# Patient Record
Sex: Female | Born: 1995 | Race: Black or African American | Hispanic: No | Marital: Single | Smoking: Never smoker
Health system: Southern US, Community
[De-identification: ages and names within clinical notes are randomized; demographics above are authoritative.]

## PROBLEM LIST (undated history)

## (undated) DIAGNOSIS — N83209 Unspecified ovarian cyst, unspecified side: Secondary | ICD-10-CM

## (undated) DIAGNOSIS — E0590 Thyrotoxicosis, unspecified without thyrotoxic crisis or storm: Secondary | ICD-10-CM

## (undated) DIAGNOSIS — F419 Anxiety disorder, unspecified: Secondary | ICD-10-CM

## (undated) DIAGNOSIS — D649 Anemia, unspecified: Secondary | ICD-10-CM

## (undated) DIAGNOSIS — J45909 Unspecified asthma, uncomplicated: Secondary | ICD-10-CM

## (undated) DIAGNOSIS — D219 Benign neoplasm of connective and other soft tissue, unspecified: Secondary | ICD-10-CM

## (undated) DIAGNOSIS — F32A Depression, unspecified: Secondary | ICD-10-CM

## (undated) DIAGNOSIS — R569 Unspecified convulsions: Secondary | ICD-10-CM

## (undated) HISTORY — PX: DILATION AND CURETTAGE OF UTERUS: SHX78

## (undated) HISTORY — PX: WISDOM TOOTH EXTRACTION: SHX21

---

## 2021-07-27 ENCOUNTER — Other Ambulatory Visit: Payer: Self-pay

## 2021-07-27 ENCOUNTER — Emergency Department (HOSPITAL_COMMUNITY)
Admission: EM | Admit: 2021-07-27 | Discharge: 2021-07-27 | Disposition: A | Discharge status: Home / Self Care | Payer: Self-pay | Attending: Emergency Medicine | Admitting: Emergency Medicine

## 2021-07-27 ENCOUNTER — Encounter (HOSPITAL_COMMUNITY): Payer: Self-pay | Admitting: Emergency Medicine

## 2021-07-27 ENCOUNTER — Emergency Department (HOSPITAL_COMMUNITY): Payer: Self-pay

## 2021-07-27 DIAGNOSIS — R109 Unspecified abdominal pain: Secondary | ICD-10-CM | POA: Insufficient documentation

## 2021-07-27 DIAGNOSIS — R197 Diarrhea, unspecified: Secondary | ICD-10-CM | POA: Insufficient documentation

## 2021-07-27 DIAGNOSIS — R112 Nausea with vomiting, unspecified: Secondary | ICD-10-CM | POA: Insufficient documentation

## 2021-07-27 LAB — COMPREHENSIVE METABOLIC PANEL
ALT: 12 U/L (ref 0–44)
AST: 16 U/L (ref 15–41)
Albumin: 4.8 g/dL (ref 3.5–5.0)
Alkaline Phosphatase: 53 U/L (ref 38–126)
Anion gap: 6 (ref 5–15)
BUN: 7 mg/dL (ref 6–20)
CO2: 27 mmol/L (ref 22–32)
Calcium: 9.9 mg/dL (ref 8.9–10.3)
Chloride: 109 mmol/L (ref 98–111)
Creatinine, Ser: 0.77 mg/dL (ref 0.44–1.00)
GFR, Estimated: >60 mL/min (ref >60)
Glucose, Bld: 97 mg/dL (ref 70–99)
Potassium: 3.3 mmol/L — ABNORMAL LOW (ref 3.5–5.1)
Sodium: 142 mmol/L (ref 135–145)
Total Bilirubin: 0.9 mg/dL (ref 0.3–1.2)
Total Protein: 8.1 g/dL (ref 6.5–8.1)

## 2021-07-27 LAB — URINALYSIS, ROUTINE W REFLEX MICROSCOPIC
Bilirubin Urine: NEGATIVE
Glucose, UA: NEGATIVE mg/dL
Hgb urine dipstick: NEGATIVE
Ketones, ur: NEGATIVE mg/dL
Leukocytes,Ua: NEGATIVE
Nitrite: NEGATIVE
Protein, ur: NEGATIVE mg/dL
Specific Gravity, Urine: 1.02 (ref 1.005–1.030)
pH: 6 (ref 5.0–8.0)

## 2021-07-27 LAB — CBC
HCT: 37.6 % (ref 36.0–46.0)
Hemoglobin: 11.5 g/dL — ABNORMAL LOW (ref 12.0–15.0)
MCH: 26.1 pg (ref 26.0–34.0)
MCHC: 30.6 g/dL (ref 30.0–36.0)
MCV: 85.5 fL (ref 80.0–100.0)
Platelets: 475 10*3/uL — ABNORMAL HIGH (ref 150–400)
RBC: 4.4 MIL/uL (ref 3.87–5.11)
RDW: 14.2 % (ref 11.5–15.5)
WBC: 5.3 10*3/uL (ref 4.0–10.5)
nRBC: 0 % (ref 0.0–0.2)

## 2021-07-27 LAB — LACTIC ACID, PLASMA: Lactic Acid, Venous: 1.2 mmol/L (ref 0.5–1.9)

## 2021-07-27 LAB — LIPASE, BLOOD: Lipase: 34 U/L (ref 11–51)

## 2021-07-27 LAB — CBG MONITORING, ED: Glucose-Capillary: 85 mg/dL (ref 70–99)

## 2021-07-27 LAB — I-STAT BETA HCG BLOOD, ED (MC, WL, AP ONLY): I-stat hCG, quantitative: <5 m[IU]/mL (ref <5)

## 2021-07-27 MED ORDER — ONDANSETRON HCL 4 MG/2ML IJ SOLN
INTRAMUSCULAR | Status: AC
  Filled 2021-07-27: qty 2

## 2021-07-27 MED ORDER — ACETAMINOPHEN 325 MG PO TABS
650.0000 mg | ORAL_TABLET | Freq: Once | ORAL | Status: AC
  Administered 2021-07-27: 650 mg via ORAL
  Filled 2021-07-27: qty 2

## 2021-07-27 MED ORDER — ONDANSETRON 4 MG PO TBDP: 4.0000 mg | ORAL_TABLET | Freq: Three times a day (TID) | ORAL | 0 refills | Status: DC | PRN

## 2021-07-27 MED ORDER — OMEPRAZOLE 20 MG PO CPDR: 20.0000 mg | DELAYED_RELEASE_CAPSULE | Freq: Every day | ORAL | 0 refills | Status: DC

## 2021-07-27 MED ORDER — POTASSIUM CHLORIDE 20 MEQ PO PACK
40.0000 meq | PACK | Freq: Once | ORAL | Status: AC
  Administered 2021-07-27: 40 meq via ORAL
  Filled 2021-07-27: qty 2

## 2021-07-27 MED ORDER — ONDANSETRON HCL 4 MG/2ML IJ SOLN
4.0000 mg | Freq: Once | INTRAMUSCULAR | Status: AC
  Administered 2021-07-27: 4 mg via INTRAVENOUS

## 2021-07-27 MED ORDER — SODIUM CHLORIDE 0.9 % IV BOLUS
1000.0000 mL | Freq: Once | INTRAVENOUS | Status: AC
  Administered 2021-07-27: 1000 mL via INTRAVENOUS

## 2021-07-27 NOTE — ED Notes (Signed)
EDP at the bedside.  ?

## 2021-07-27 NOTE — Discharge Instructions (Addendum)
Call your primary care doctor or specialist as discussed in the next 2-3 days.   Return immediately back to the ER if:  Your symptoms worsen within the next 12-24 hours. You develop new symptoms such as new fevers, persistent vomiting, new pain, shortness of breath, or new weakness or numbness, or if you have any other concerns.  

## 2021-07-27 NOTE — ED Provider Notes (Signed)
[facility]-EMERGENCY DEPT Provider Note   CSN: [id number] Arrival date & time: 07/27/21  3382     History Chief Complaint  Patient presents with   Flank Pain   Emesis    Debbie Stewart is a 25 y.o. female.  Patient presents ER chief complaint of right-sided flank pain ongoing since yesterday.  Describes it as sharp and aching persistent.  Associate with nausea and vomiting diarrhea.  She states her vomitus was about 5-7 times yesterday, described as blood-tinged.  Otherwise denies fevers or cough denies headache or chest pain or abdominal pain.  Denies pain with urination or urinary frequency.      History reviewed. No pertinent past medical history.  There are no problems to display for this patient.   History reviewed. No pertinent surgical history.   OB History   No obstetric history on file.     No family history on file.     Home Medications Prior to Admission medications   Medication Sig Start Date End Date Taking? Authorizing Provider  omeprazole (PRILOSEC) 20 MG capsule Take 1 capsule (20 mg total) by mouth daily for 20 days. 07/27/21 08/16/21 Yes Cheryll Cockayne, MD  ondansetron (ZOFRAN ODT) 4 MG disintegrating tablet Take 1 tablet (4 mg total) by mouth every 8 (eight) hours as needed for up to 20 doses for nausea or vomiting. 07/27/21  Yes Meryem Haertel, Eustace Moore, MD    Allergies    Shellfish allergy  Review of Systems   Review of Systems  Constitutional:  Negative for fever.  HENT:  Negative for ear pain.   Eyes:  Negative for pain.  Respiratory:  Negative for cough.   Cardiovascular:  Negative for chest pain.  Gastrointestinal:  Negative for abdominal pain.  Genitourinary:  Positive for flank pain.  Musculoskeletal:  Negative for back pain.  Skin:  Negative for rash.  Neurological:  Negative for headaches.   Physical Exam Updated Vital Signs BP (!) 135/92   Pulse 62   Temp 97.9 F (36.6 C) (Oral)   Resp 17   LMP 07/21/2021  Comment: Per Dr. Audley Hose patient's preg test is negative  SpO2 100%   Physical Exam Constitutional:      General: She is not in acute distress.    Appearance: Normal appearance.  HENT:     Head: Normocephalic.     Nose: Nose normal.  Eyes:     Extraocular Movements: Extraocular movements intact.  Cardiovascular:     Rate and Rhythm: Normal rate.  Pulmonary:     Effort: Pulmonary effort is normal.  Abdominal:     Tenderness: There is no abdominal tenderness. There is right CVA tenderness. There is no guarding or rebound.  Musculoskeletal:        General: Normal range of motion.     Cervical back: Normal range of motion.  Neurological:     General: No focal deficit present.     Mental Status: She is alert. Mental status is at baseline.    ED Results / Procedures / Treatments   Labs (all labs ordered are listed, but only abnormal results are displayed) Labs Reviewed  COMPREHENSIVE METABOLIC PANEL - Abnormal; Notable for the following components:      Result Value   Potassium 3.3 (*)    All other components within normal limits  CBC - Abnormal; Notable for the following components:   Hemoglobin 11.5 (*)    Platelets 475 (*)    All other components within normal limits  CULTURE, BLOOD (ROUTINE X 2)  CULTURE, BLOOD (ROUTINE X 2)  LIPASE, BLOOD  URINALYSIS, ROUTINE W REFLEX MICROSCOPIC  LACTIC ACID, PLASMA  I-STAT BETA HCG BLOOD, ED ([facility], [facility], [facility] ONLY)  CBG MONITORING, ED    EKG None  Radiology CT Abdomen Pelvis Wo Contrast  Result Date: 07/27/2021 CLINICAL DATA:  Right side abdominal pain, flank pain EXAM: CT ABDOMEN AND PELVIS WITHOUT CONTRAST TECHNIQUE: Multidetector CT imaging of the abdomen and pelvis was performed following the standard protocol without IV contrast. COMPARISON:  None. FINDINGS: Lower chest: Lung bases are clear. No effusions. Heart is normal size. Hepatobiliary: No focal hepatic abnormality. Gallbladder unremarkable. Pancreas: No focal abnormality or  ductal dilatation. Spleen: No focal abnormality.  Normal size. Adrenals/Urinary Tract: No adrenal abnormality. No focal renal abnormality. No stones or hydronephrosis. Urinary bladder is unremarkable. Stomach/Bowel: Normal appendix. Stomach, large and small bowel grossly unremarkable. Vascular/Lymphatic: No evidence of aneurysm or adenopathy. Reproductive: Uterus and left adnexa unremarkable. 4 cm cyst in the right adnexa. Other: No free fluid or free air. Musculoskeletal: No acute bony abnormality. IMPRESSION: Normal appendix. 4 cm right adnexal/ovarian cyst. Electronically Signed   By: Charlett Nose M.D.   On: 07/27/2021 10:50    Procedures Procedures   Medications Ordered in ED Medications  potassium chloride (KLOR-CON) packet 40 mEq (has no administration in time range)  sodium chloride 0.9 % bolus 1,000 mL (1,000 mLs Intravenous New Bag/Given 07/27/21 0906)  acetaminophen (TYLENOL) tablet 650 mg (650 mg Oral Given 07/27/21 0913)  ondansetron (ZOFRAN) injection 4 mg (4 mg Intravenous Not Given 07/27/21 0950)    ED Course  I have reviewed the triage vital signs and the nursing notes.  Pertinent labs & imaging results that were available during my care of the patient were reviewed by me and considered in my medical decision making (see chart for details).    MDM Rules/Calculators/A&P                           Labs, urinalysis, CT imaging are all unremarkable no acute findings noted.  Patient vomited here, given Zofran with improvement of symptoms.  Vomitus appears nonbloody bilious.  Labs show hemoglobin 11 vital signs within normal limits.  Will advise outpatient follow-up with her doctor and GI specialist within the week.  Recommending immediate return for worsening pain worsening symptoms fevers or any additional concerns.  Final Clinical Impression(s) / ED Diagnoses Final diagnoses:  Flank pain    Rx / DC Orders ED Discharge Orders          Ordered    ondansetron (ZOFRAN ODT)  4 MG disintegrating tablet  Every 8 hours PRN        07/27/21 1113    omeprazole (PRILOSEC) 20 MG capsule  Daily        07/27/21 1113             Cheryll Cockayne, MD 07/27/21 1114

## 2021-07-27 NOTE — ED Notes (Signed)
Pt ambulatory in ED lobby. 

## 2021-07-27 NOTE — ED Triage Notes (Signed)
Per pt, sates right flank pain since yesterday-states she has also vomited 7 times since symptoms started-blood tinged-

## 2021-07-27 NOTE — ED Notes (Signed)
POC glucose 85. Pt provided 8oz apple juice at this time. EDP aware.

## 2021-07-27 NOTE — ED Notes (Signed)
Patient Beta result is <5.0 IU/L

## 2021-07-27 NOTE — ED Notes (Signed)
Pt actively vomiting at the bedside. IV Zofran 4mg  overridden. EDP notified and aware.

## 2021-07-27 NOTE — ED Notes (Signed)
This nurse started a peripheral IV in pt's right AC and obtained blood work. Pt stated she was dizzy and felt like she was going to pass out. This nurse placed pt in trendelenburg position and obtained blood pressure and pulse which was WNL limits. POC BG obtained confirmed a pt bg of 85. EDP notified and aware.

## 2021-08-01 LAB — CULTURE, BLOOD (ROUTINE X 2)
Culture: NO GROWTH
Culture: NO GROWTH

## 2022-03-19 ENCOUNTER — Emergency Department (HOSPITAL_COMMUNITY)
Admission: EM | Admit: 2022-03-19 | Discharge: 2022-03-19 | Disposition: A | Discharge status: Home / Self Care | Payer: Self-pay | Attending: Student | Admitting: Student

## 2022-03-19 ENCOUNTER — Emergency Department (HOSPITAL_COMMUNITY): Payer: Self-pay

## 2022-03-19 ENCOUNTER — Encounter (HOSPITAL_COMMUNITY): Payer: Self-pay

## 2022-03-19 ENCOUNTER — Other Ambulatory Visit: Payer: Self-pay

## 2022-03-19 DIAGNOSIS — M25511 Pain in right shoulder: Secondary | ICD-10-CM | POA: Insufficient documentation

## 2022-03-19 DIAGNOSIS — R079 Chest pain, unspecified: Secondary | ICD-10-CM | POA: Insufficient documentation

## 2022-03-19 DIAGNOSIS — F419 Anxiety disorder, unspecified: Secondary | ICD-10-CM | POA: Insufficient documentation

## 2022-03-19 DIAGNOSIS — R42 Dizziness and giddiness: Secondary | ICD-10-CM | POA: Insufficient documentation

## 2022-03-19 DIAGNOSIS — R1084 Generalized abdominal pain: Secondary | ICD-10-CM | POA: Insufficient documentation

## 2022-03-19 DIAGNOSIS — K5900 Constipation, unspecified: Secondary | ICD-10-CM | POA: Insufficient documentation

## 2022-03-19 DIAGNOSIS — N9489 Other specified conditions associated with female genital organs and menstrual cycle: Secondary | ICD-10-CM | POA: Insufficient documentation

## 2022-03-19 HISTORY — DX: Anxiety disorder, unspecified: F41.9

## 2022-03-19 HISTORY — DX: Anemia, unspecified: D64.9

## 2022-03-19 HISTORY — DX: Depression, unspecified: F32.A

## 2022-03-19 HISTORY — DX: Unspecified ovarian cyst, unspecified side: N83.209

## 2022-03-19 LAB — CBC WITH DIFFERENTIAL/PLATELET
Abs Immature Granulocytes: 0.01 10*3/uL (ref 0.00–0.07)
Basophils Absolute: 0 10*3/uL (ref 0.0–0.1)
Basophils Relative: 0 %
Eosinophils Absolute: 0.2 10*3/uL (ref 0.0–0.5)
Eosinophils Relative: 2 %
HCT: 31.2 % — ABNORMAL LOW (ref 36.0–46.0)
Hemoglobin: 9.7 g/dL — ABNORMAL LOW (ref 12.0–15.0)
Immature Granulocytes: 0 %
Lymphocytes Relative: 44 %
Lymphs Abs: 3 10*3/uL (ref 0.7–4.0)
MCH: 25.9 pg — ABNORMAL LOW (ref 26.0–34.0)
MCHC: 31.1 g/dL (ref 30.0–36.0)
MCV: 83.2 fL (ref 80.0–100.0)
Monocytes Absolute: 0.5 10*3/uL (ref 0.1–1.0)
Monocytes Relative: 7 %
Neutro Abs: 3.1 10*3/uL (ref 1.7–7.7)
Neutrophils Relative %: 47 %
Platelets: 374 10*3/uL (ref 150–400)
RBC: 3.75 MIL/uL — ABNORMAL LOW (ref 3.87–5.11)
RDW: 14.6 % (ref 11.5–15.5)
WBC: 6.8 10*3/uL (ref 4.0–10.5)
nRBC: 0 % (ref 0.0–0.2)

## 2022-03-19 LAB — COMPREHENSIVE METABOLIC PANEL
ALT: 20 U/L (ref 0–44)
AST: 20 U/L (ref 15–41)
Albumin: 4.2 g/dL (ref 3.5–5.0)
Alkaline Phosphatase: 57 U/L (ref 38–126)
Anion gap: 8 (ref 5–15)
BUN: 8 mg/dL (ref 6–20)
CO2: 23 mmol/L (ref 22–32)
Calcium: 9 mg/dL (ref 8.9–10.3)
Chloride: 107 mmol/L (ref 98–111)
Creatinine, Ser: 0.59 mg/dL (ref 0.44–1.00)
GFR, Estimated: >60 mL/min (ref >60)
Glucose, Bld: 88 mg/dL (ref 70–99)
Potassium: 3.5 mmol/L (ref 3.5–5.1)
Sodium: 138 mmol/L (ref 135–145)
Total Bilirubin: 0.5 mg/dL (ref 0.3–1.2)
Total Protein: 7.4 g/dL (ref 6.5–8.1)

## 2022-03-19 LAB — URINALYSIS, ROUTINE W REFLEX MICROSCOPIC
Bilirubin Urine: NEGATIVE
Glucose, UA: NEGATIVE mg/dL
Hgb urine dipstick: NEGATIVE
Ketones, ur: NEGATIVE mg/dL
Leukocytes,Ua: NEGATIVE
Nitrite: NEGATIVE
Protein, ur: NEGATIVE mg/dL
Specific Gravity, Urine: 1.009 (ref 1.005–1.030)
pH: 6 (ref 5.0–8.0)

## 2022-03-19 LAB — TROPONIN I (HIGH SENSITIVITY)
Troponin I (High Sensitivity): <2 ng/L (ref <18)
Troponin I (High Sensitivity): <2 ng/L (ref <18)

## 2022-03-19 LAB — I-STAT BETA HCG BLOOD, ED (MC, WL, AP ONLY): I-stat hCG, quantitative: <5 m[IU]/mL (ref <5)

## 2022-03-19 MED ORDER — NAPROXEN 375 MG PO TABS: 375.0000 mg | ORAL_TABLET | Freq: Two times a day (BID) | ORAL | 0 refills | Status: DC

## 2022-03-19 MED ORDER — ONDANSETRON HCL 4 MG/2ML IJ SOLN
4.0000 mg | Freq: Once | INTRAMUSCULAR | Status: AC
  Administered 2022-03-19: 4 mg via INTRAVENOUS
  Filled 2022-03-19: qty 2

## 2022-03-19 MED ORDER — MORPHINE SULFATE (PF) 4 MG/ML IV SOLN
4.0000 mg | Freq: Once | INTRAVENOUS | Status: AC
  Administered 2022-03-19: 4 mg via INTRAVENOUS
  Filled 2022-03-19: qty 1

## 2022-03-19 MED ORDER — SODIUM CHLORIDE 0.9 % IV BOLUS
1000.0000 mL | Freq: Once | INTRAVENOUS | Status: AC
  Administered 2022-03-19: 1000 mL via INTRAVENOUS

## 2022-03-19 MED ORDER — DICYCLOMINE HCL 20 MG PO TABS: 20.0000 mg | ORAL_TABLET | Freq: Two times a day (BID) | ORAL | 0 refills | Status: DC

## 2022-03-19 MED ORDER — ONDANSETRON 4 MG PO TBDP: 4.0000 mg | ORAL_TABLET | Freq: Three times a day (TID) | ORAL | 0 refills | Status: DC | PRN

## 2022-03-19 MED ORDER — HYDROXYZINE HCL 25 MG PO TABS: 25.0000 mg | ORAL_TABLET | Freq: Three times a day (TID) | ORAL | 0 refills | Status: DC | PRN

## 2022-03-19 MED ORDER — IOHEXOL 300 MG/ML  SOLN
100.0000 mL | Freq: Once | INTRAMUSCULAR | Status: AC | PRN
Start: 2022-03-19 — End: 2022-03-19
  Administered 2022-03-19: 100 mL via INTRAVENOUS

## 2022-03-19 MED ORDER — SODIUM CHLORIDE (PF) 0.9 % IJ SOLN
INTRAMUSCULAR | Status: AC
  Filled 2022-03-19: qty 50

## 2022-03-19 NOTE — ED Notes (Signed)
Patient aware urine sample is needed. Instructed to call out using call bell for restroom assistance.
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
## 2022-03-19 NOTE — ED Triage Notes (Signed)
Patient c/o mid upper chest pain, SOB and dizziness x 2 hours. Patient states the pain radiates into the right shoulder.   Patient reports an ovarian cyst and also c/o RLQ abdominal pain.

## 2022-03-19 NOTE — Discharge Instructions (Signed)
Your work-up today was reassuring against a serious cause of your chest pain, or abdominal pain.  You do have ovarian cysts bilaterally.  On the right side your cyst measures 4.4 cm, and on the left measures 3.8 cm.  I have sent Zofran and Bentyl to use as needed for abdominal pain.  Also recommended aggressive bowel regimen given your chronic constipation.  You state you used to be followed by a gastroenterologist for this however you do not have one since moving to [place].  I have attached gastroenterology referral for you.  I have sent an naproxen for you to take over the next 7 to 10 days to see if it resolves your chest pain.  You have had significant improvement in your pain level while in the emergency room.  If you have any worsening symptoms please return to the emergency room otherwise establish care with [facility] health community health and wellness clinic.  I have also sent in hydroxyzine into the pharmacy for you to keep on hand and to use as needed for anxiety.

## 2022-03-19 NOTE — ED Provider Triage Note (Signed)
Emergency Medicine Provider Triage Evaluation Note  Debbie Stewart , a 26 y.o. female  was evaluated in triage.  Pt complains of chest pain, shortness of breath, and RLQ abdominal pain. She states that her symptoms all began a few hours ago with substernal chest pain that is sharp in nature and radiates to her right arm. She states that her hand is numb as well. She states that the pain is worse with inspiration and relieved by taking shallow breaths. She also states that she has RLQ pain consistent with when she has had a ruptured ovarian cyst in the pain. States that she is 8 days late on her menstrual cycle but attributes this to taking plan B 2 weeks ago.  Denies fevers, chills, nausea, vomiting, or diarrhea, leg pain, or leg swelling.  Review of Systems  Positive:  Negative: See above  Physical Exam  BP 130/81 (BP Location: Right Arm)   Pulse 75   Temp 98.1 F (36.7 C) (Oral)   Resp 16   Ht 5\' 7"  (1.702 m)   Wt 81.6 kg   LMP 02/11/2022   SpO2 100%   BMI 28.19 kg/m  Gen:   Awake, no distress   Resp:  Normal effort  MSK:   Moves extremities without difficulty  Other:  Right upper chest is tender to palpation. RLQ tenderness to palpation  Medical Decision Making  Medically screening exam initiated at 5:22 PM.  Appropriate orders placed.  Debbie Stewart was informed that the remainder of the evaluation will be completed by another provider, this initial triage assessment does not replace that evaluation, and the importance of remaining in the ED until their evaluation is complete.     Bud Face, PA-C 03/19/22 1726

## 2022-06-19 ENCOUNTER — Other Ambulatory Visit: Payer: Self-pay

## 2022-06-19 ENCOUNTER — Emergency Department (HOSPITAL_COMMUNITY)
Admission: EM | Admit: 2022-06-19 | Discharge: 2022-06-19 | Disposition: A | Discharge status: Home / Self Care | Payer: Self-pay | Attending: Emergency Medicine | Admitting: Emergency Medicine

## 2022-06-19 ENCOUNTER — Encounter (HOSPITAL_COMMUNITY): Payer: Self-pay

## 2022-06-19 ENCOUNTER — Emergency Department (HOSPITAL_COMMUNITY): Payer: Self-pay

## 2022-06-19 DIAGNOSIS — O99891 Other specified diseases and conditions complicating pregnancy: Secondary | ICD-10-CM

## 2022-06-19 DIAGNOSIS — O4690 Antepartum hemorrhage, unspecified, unspecified trimester: Secondary | ICD-10-CM

## 2022-06-19 DIAGNOSIS — O209 Hemorrhage in early pregnancy, unspecified: Secondary | ICD-10-CM | POA: Insufficient documentation

## 2022-06-19 DIAGNOSIS — O26891 Other specified pregnancy related conditions, first trimester: Secondary | ICD-10-CM

## 2022-06-19 DIAGNOSIS — O200 Threatened abortion: Secondary | ICD-10-CM | POA: Insufficient documentation

## 2022-06-19 DIAGNOSIS — Z3A01 Less than 8 weeks gestation of pregnancy: Secondary | ICD-10-CM | POA: Insufficient documentation

## 2022-06-19 DIAGNOSIS — R8271 Bacteriuria: Secondary | ICD-10-CM | POA: Insufficient documentation

## 2022-06-19 DIAGNOSIS — O2390 Unspecified genitourinary tract infection in pregnancy, unspecified trimester: Secondary | ICD-10-CM | POA: Insufficient documentation

## 2022-06-19 LAB — CBC WITH DIFFERENTIAL/PLATELET
Abs Immature Granulocytes: 0.02 10*3/uL (ref 0.00–0.07)
Basophils Absolute: 0 10*3/uL (ref 0.0–0.1)
Basophils Relative: 0 %
Eosinophils Absolute: 0.1 10*3/uL (ref 0.0–0.5)
Eosinophils Relative: 1 %
HCT: 32.7 % — ABNORMAL LOW (ref 36.0–46.0)
Hemoglobin: 10.2 g/dL — ABNORMAL LOW (ref 12.0–15.0)
Immature Granulocytes: 0 %
Lymphocytes Relative: 24 %
Lymphs Abs: 1.8 10*3/uL (ref 0.7–4.0)
MCH: 26.2 pg (ref 26.0–34.0)
MCHC: 31.2 g/dL (ref 30.0–36.0)
MCV: 84.1 fL (ref 80.0–100.0)
Monocytes Absolute: 0.4 10*3/uL (ref 0.1–1.0)
Monocytes Relative: 5 %
Neutro Abs: 5.4 10*3/uL (ref 1.7–7.7)
Neutrophils Relative %: 70 %
Platelets: 364 10*3/uL (ref 150–400)
RBC: 3.89 MIL/uL (ref 3.87–5.11)
RDW: 15.7 % — ABNORMAL HIGH (ref 11.5–15.5)
WBC: 7.7 10*3/uL (ref 4.0–10.5)
nRBC: 0 % (ref 0.0–0.2)

## 2022-06-19 LAB — COMPREHENSIVE METABOLIC PANEL
ALT: 10 U/L (ref 0–44)
AST: 14 U/L — ABNORMAL LOW (ref 15–41)
Albumin: 3.9 g/dL (ref 3.5–5.0)
Alkaline Phosphatase: 38 U/L (ref 38–126)
Anion gap: 7 (ref 5–15)
BUN: 6 mg/dL (ref 6–20)
CO2: 23 mmol/L (ref 22–32)
Calcium: 8.8 mg/dL — ABNORMAL LOW (ref 8.9–10.3)
Chloride: 107 mmol/L (ref 98–111)
Creatinine, Ser: 0.57 mg/dL (ref 0.44–1.00)
GFR, Estimated: >60 mL/min (ref >60)
Glucose, Bld: 92 mg/dL (ref 70–99)
Potassium: 3 mmol/L — ABNORMAL LOW (ref 3.5–5.1)
Sodium: 137 mmol/L (ref 135–145)
Total Bilirubin: 0.3 mg/dL (ref 0.3–1.2)
Total Protein: 6.8 g/dL (ref 6.5–8.1)

## 2022-06-19 LAB — URINALYSIS, ROUTINE W REFLEX MICROSCOPIC
Bilirubin Urine: NEGATIVE
Glucose, UA: NEGATIVE mg/dL
Ketones, ur: NEGATIVE mg/dL
Leukocytes,Ua: NEGATIVE
Nitrite: NEGATIVE
Protein, ur: NEGATIVE mg/dL
Specific Gravity, Urine: 1.006 (ref 1.005–1.030)
pH: 7 (ref 5.0–8.0)

## 2022-06-19 LAB — I-STAT BETA HCG BLOOD, ED (MC, WL, AP ONLY): I-stat hCG, quantitative: >2000 m[IU]/mL — ABNORMAL HIGH (ref <5)

## 2022-06-19 LAB — TYPE AND SCREEN
ABO/RH(D): O POS
Antibody Screen: NEGATIVE

## 2022-06-19 LAB — HCG, QUANTITATIVE, PREGNANCY: hCG, Beta Chain, Quant, S: 56131 m[IU]/mL — ABNORMAL HIGH (ref <5)

## 2022-06-19 LAB — PROTIME-INR
INR: 1.1 (ref 0.8–1.2)
Prothrombin Time: 13.6 seconds (ref 11.4–15.2)

## 2022-06-19 LAB — LIPASE, BLOOD: Lipase: 31 U/L (ref 11–51)

## 2022-06-19 MED ORDER — ACETAMINOPHEN 500 MG PO TABS
1000.0000 mg | ORAL_TABLET | Freq: Once | ORAL | Status: AC
Start: 2022-06-19 — End: 2022-06-19
  Administered 2022-06-19: 1000 mg via ORAL
  Filled 2022-06-19: qty 2

## 2022-06-19 MED ORDER — ONDANSETRON 4 MG PO TBDP: 4.0000 mg | ORAL_TABLET | Freq: Three times a day (TID) | ORAL | 1 refills | Status: DC | PRN

## 2022-06-19 MED ORDER — POTASSIUM CHLORIDE CRYS ER 20 MEQ PO TBCR
40.0000 meq | EXTENDED_RELEASE_TABLET | Freq: Once | ORAL | Status: AC
  Administered 2022-06-19: 40 meq via ORAL
  Filled 2022-06-19: qty 2

## 2022-06-19 MED ORDER — ONDANSETRON HCL 4 MG/2ML IJ SOLN
4.0000 mg | Freq: Once | INTRAMUSCULAR | Status: AC
  Administered 2022-06-19: 4 mg via INTRAVENOUS
  Filled 2022-06-19: qty 2

## 2022-06-19 MED ORDER — FOSFOMYCIN TROMETHAMINE 3 G PO PACK
3.0000 g | PACK | Freq: Once | ORAL | Status: AC
  Administered 2022-06-19: 3 g via ORAL
  Filled 2022-06-19: qty 3

## 2022-06-19 MED ORDER — LACTATED RINGERS IV BOLUS
1000.0000 mL | Freq: Once | INTRAVENOUS | Status: AC
  Administered 2022-06-19: 1000 mL via INTRAVENOUS

## 2022-06-19 NOTE — ED Provider Notes (Signed)
[facility]-EMERGENCY DEPT Provider Note   CSN: [id number] Arrival date & time: 06/19/22  0831     History  Chief Complaint  Patient presents with   Abdominal Cramping   Vaginal Bleeding    Debbie Stewart is a 26 y.o. female with a history of ovarian cysts and miscarriage around 6 weeks who presents with 3 days of abdominal pain and 1 day of vaginal bleeding.  Also, nausea and vomiting ongoing for 2 weeks.  Abdominal pain started 3 days ago.  Is described as an intermittent "stabbing" pain, severe.  Localized to suprapubic region.  Feels better when she lays down.  She also began to have vaginal bleeding yesterday.  Blood is described as bright red and similar in volume to first day of her period.  She has gone through 2 tampons in the last 24 hours.  She is also having some diarrhea that started around the onset of her abdominal pain.  She receives her care from [facility].  She had an ultrasound a week ago that revealed an intrauterine 4-1/2-week gestation pregnancy.  For her nausea and vomiting, she has been trying herbal supplements and teas per recommendation of nurse midwife.  Recently she has not been able to keep food or medicines down.  She complains of some dizziness on standing.  Social history: Lives in [place] Currently working 2 jobs and in nursing school Does not use alcohol or drugs.  Abdominal Cramping  Vaginal Bleeding      Home Medications Prior to Admission medications   Medication Sig Start Date End Date Taking? Authorizing Provider  dicyclomine (BENTYL) 20 MG tablet Take 1 tablet (20 mg total) by mouth 2 (two) times daily. 03/19/22   Marita Kansas, PA-C  hydrOXYzine (ATARAX) 25 MG tablet Take 1 tablet (25 mg total) by mouth every 8 (eight) hours as needed. 03/19/22   Marita Kansas, PA-C  naproxen (NAPROSYN) 375 MG tablet Take 1 tablet (375 mg total) by mouth 2 (two) times daily. 03/19/22   Marita Kansas, PA-C  omeprazole (PRILOSEC) 20 MG  capsule Take 1 capsule (20 mg total) by mouth daily for 20 days. 07/27/21 08/16/21  Cheryll Cockayne, MD  ondansetron (ZOFRAN-ODT) 4 MG disintegrating tablet Take 1 tablet (4 mg total) by mouth every 8 (eight) hours as needed for nausea or vomiting. 06/19/22   Marrianne Mood, MD      Allergies    Shellfish allergy    Review of Systems   Review of Systems  Genitourinary:  Positive for vaginal bleeding.  All other systems reviewed and are negative.   Physical Exam Updated Vital Signs BP 111/60   Pulse 67   Temp 98.4 F (36.9 C) (Oral)   Resp 17   LMP 05/03/2022   SpO2 100%  Physical Exam Vitals and nursing note reviewed.  Constitutional:      General: She is not in acute distress.    Appearance: She is well-developed.  HENT:     Head: Normocephalic and atraumatic.  Eyes:     Conjunctiva/sclera: Conjunctivae normal.  Cardiovascular:     Rate and Rhythm: Normal rate and regular rhythm.     Heart sounds: No murmur heard. Pulmonary:     Effort: Pulmonary effort is normal. No respiratory distress.     Breath sounds: Normal breath sounds.  Abdominal:     Palpations: Abdomen is soft.     Tenderness: There is abdominal tenderness.  Musculoskeletal:        General: No  swelling.     Cervical back: Neck supple.  Skin:    General: Skin is warm and dry.     Capillary Refill: Capillary refill takes less than 2 seconds.  Neurological:     Mental Status: She is alert.  Psychiatric:        Mood and Affect: Mood normal.     ED Results / Procedures / Treatments   Labs (all labs ordered are listed, but only abnormal results are displayed) Labs Reviewed  CBC WITH DIFFERENTIAL/PLATELET - Abnormal; Notable for the following components:      Result Value   Hemoglobin 10.2 (*)    HCT 32.7 (*)    RDW 15.7 (*)    All other components within normal limits  COMPREHENSIVE METABOLIC PANEL - Abnormal; Notable for the following components:   Potassium 3.0 (*)    Calcium 8.8 (*)    AST  14 (*)    All other components within normal limits  URINALYSIS, ROUTINE W REFLEX MICROSCOPIC - Abnormal; Notable for the following components:   APPearance HAZY (*)    Hgb urine dipstick SMALL (*)    Bacteria, UA MANY (*)    All other components within normal limits  HCG, QUANTITATIVE, PREGNANCY - Abnormal; Notable for the following components:   hCG, Beta Chain, Quant, S 56,131 (*)    All other components within normal limits  I-STAT BETA HCG BLOOD, ED ([facility], [facility], [facility] ONLY) - Abnormal; Notable for the following components:   I-stat hCG, quantitative >2,000.0 (*)    All other components within normal limits  LIPASE, BLOOD  PROTIME-INR  TYPE AND SCREEN  ABO/RH    EKG None  Radiology US OB Comp Less 14 Wks  Result Date: 06/19/2022 CLINICAL DATA:  Vaginal bleeding.  Beta HCG of 56,131 EXAM: OBSTETRIC <14 WK [place] AND TRANSVAGINAL OB US TECHNIQUE: Both transabdominal and transvaginal ultrasound examinations were performed for complete evaluation of the gestation as well as the maternal uterus, adnexal regions, and pelvic cul-de-sac. Transvaginal technique was performed to assess early pregnancy. COMPARISON:  CT Mar 19, 2022. FINDINGS: Intrauterine gestational sac: Single Yolk sac:  Visualized. Embryo:  Visualized. Cardiac Activity: Visualized. Heart Rate: 123 bpm CRL:  6.0 mm   6 w   3 d                  [place] [facility]: February 09, 2023 Subchorionic hemorrhage:  None visualized. Maternal uterus/adnexae: Thin walled cysts in the right ovary measures 6.3 x 5.0 x 3.2 cm. Corpus luteum in the right ovary. Simple appearing left ovarian cyst measures 2.5 cm IMPRESSION: Single early viable intrauterine pregnancy at 6 weeks and 3 days gestation with an ultrasound [facility] of February 09, 2023. Electronically Signed   By: Maudry Mayhew M.D.   On: 06/19/2022 16:07    Procedures Procedures  Normal sinus rhythm on continuous cardiac monitoring  Medications Ordered in ED Medications  fosfomycin (MONUROL) packet 3 g (has no  administration in time range)  lactated ringers bolus 1,000 mL (0 mLs Intravenous Stopped 06/19/22 1216)  ondansetron (ZOFRAN) injection 4 mg (4 mg Intravenous Given 06/19/22 0934)  potassium chloride SA (KLOR-CON M) CR tablet 40 mEq (40 mEq Oral Given 06/19/22 1214)  acetaminophen (TYLENOL) tablet 1,000 mg (1,000 mg Oral Given 06/19/22 1214)    ED Course/ Medical Decision Making/ A&P Clinical Course as of 06/19/22 1658  Mon Jun 19, 2022  1033 BP: 139/64 Remains hemodynamically stable. [MM]  1034 CBC with Differential(!) WNL for patient.  Mild anemia is chronic. [MM]  1102 Lipase: 31 [MM]  1102 Protime-INR WNL. [MM]  1102 Potassium(!): 3.0 Hypokalemic. Will replace with oral potassium. [MM]  1359 UA with bacteriuria and moderate pyuria. [MM]  1434 HCG, Beta Chain, Quant, S(!): 56,131 Consistent with pregnancy at [redacted] week gestation. [MM]    Clinical Course User Index [MM] Marrianne Mood, MD                           Medical Decision Making Amount and/or Complexity of Data Reviewed Labs: ordered. Decision-making details documented in ED Course. Radiology: ordered.  Risk OTC drugs. Prescription drug management.   This patient is a 26 year old female with pelvic pain and vaginal bleeding in the setting of 5 and half week intrauterine pregnancy, confirmed by prior ultrasound.  I have not personally reviewed the patient's prior ultrasound, but per her report there was no concern for ectopic pregnancy indicated at that visit.  Chief concern is for spontaneous abortion.  In addition to basic labs will obtain type and screen, ABO/Rh, PT/INR, hCG quant.  Will request OB ultrasound.  We will initiate treatment with LR bolus and IV Zofran for nausea and vomiting with signs of hypovolemia. Patient's condition improved with symptomatic therapy.  Ultrasound showed viable intrauterine pregnancy consistent with 6 weeks 3 days gestation.   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   Additional history obtained:  Additional history and/or information obtained from chart review External records from outside source obtained and reviewed including charts from prior hospitalizations.   Lab Tests:  I Ordered (or co-signed), and personally interpreted labs.  The pertinent results include:   Beta hCG quantitative: 56,131 UA with bacteriuria and moderate pyuria PT/INR within normal limits Lipase within normal limits CMP with hypokalemia to 3 CBC with moderate iron deficiency anemia.   Imaging Studies ordered:  I ordered (or co-signed) imaging studies including ultrasound OB less than 14 weeks I independently visualized and interpreted imaging which showed viable intrauterine pregnancy I agree with the radiologist interpretation   Cardiac Monitoring:  The patient was maintained on a cardiac monitor.  The cardiac monitored showed an rhythm of normal sinus rhythm. The patient was also maintained on pulse oximetry. The readings were typically within normal limits.   Medicines ordered and prescription drug management:  I ordered medication including LR 1000 mils, Tylenol, potassium chloride, ondansetron Reevaluation of the patient after these medicines showed that the patient improved  Reevaluation:  After the interventions noted above, I reevaluated the patient and found that they have :improved.    Dispostion:  After consideration of the diagnostic results and the patients response to treatment, I feel that the patent would benefit from discharge with instructions to follow-up with her obstetric provider soon as possible.  Symptomatic therapy for nausea.          Final Clinical Impression(s) / ED Diagnoses Final diagnoses:  Vaginal bleeding in pregnancy  Abdominal pain during pregnancy in first trimester   Threatened miscarriage  Bacteriuria during pregnancy    Rx / DC Orders ED Discharge Orders          Ordered    ondansetron (ZOFRAN-ODT) 4 MG disintegrating tablet  Every 8 hours PRN        06/19/22 1638  Marrianne Mood, MD 06/19/22 1658    Gerhard Munch, MD 06/26/22 [phone]

## 2022-06-19 NOTE — ED Triage Notes (Signed)
Pt reports worsening lower abdominal cramping over the past few days. Pt reports having vaginal bleeding this morning. Pt also endorses nausea and dizziness. Pt states she is about 5 1/[redacted] weeks pregnant. Hx of ovarian cysts.

## 2022-06-19 NOTE — Discharge Instructions (Addendum)
You were evaluated in the emergency department for abdominal pain and bleeding during her first trimester of pregnancy.  Your evaluation showed a normal, living fetus in your uterus, consistent with a gestational age of [redacted] weeks and 3 days.  However, because of your symptoms, it is important that you follow-up with your obstetric provider as soon as possible.  This is important for the health of you and your baby.  You were found to have a UTI.  You were treated for this in the hospital with a single dose of antibiotics.  You do not need to take antibiotics upon leaving the hospital.  I have written a prescription for Zofran to be taken every 8 hours as needed for nausea.

## 2022-06-19 NOTE — ED Notes (Signed)
Pt given warm blanket.

## 2022-06-20 ENCOUNTER — Inpatient Hospital Stay (HOSPITAL_COMMUNITY)
Admission: AD | Admit: 2022-06-20 | Discharge: 2022-06-20 | Disposition: A | Discharge status: Home / Self Care | Payer: Self-pay | Attending: Obstetrics and Gynecology | Admitting: Obstetrics and Gynecology

## 2022-06-20 ENCOUNTER — Inpatient Hospital Stay (HOSPITAL_COMMUNITY): Payer: Self-pay

## 2022-06-20 ENCOUNTER — Encounter (HOSPITAL_COMMUNITY): Payer: Self-pay | Admitting: *Deleted

## 2022-06-20 DIAGNOSIS — O468X1 Other antepartum hemorrhage, first trimester: Secondary | ICD-10-CM

## 2022-06-20 DIAGNOSIS — Z3A01 Less than 8 weeks gestation of pregnancy: Secondary | ICD-10-CM | POA: Insufficient documentation

## 2022-06-20 DIAGNOSIS — O418X10 Other specified disorders of amniotic fluid and membranes, first trimester, not applicable or unspecified: Secondary | ICD-10-CM

## 2022-06-20 DIAGNOSIS — R197 Diarrhea, unspecified: Secondary | ICD-10-CM | POA: Insufficient documentation

## 2022-06-20 DIAGNOSIS — R42 Dizziness and giddiness: Secondary | ICD-10-CM | POA: Insufficient documentation

## 2022-06-20 DIAGNOSIS — O99891 Other specified diseases and conditions complicating pregnancy: Secondary | ICD-10-CM | POA: Insufficient documentation

## 2022-06-20 DIAGNOSIS — O3481 Maternal care for other abnormalities of pelvic organs, first trimester: Secondary | ICD-10-CM | POA: Insufficient documentation

## 2022-06-20 DIAGNOSIS — N83209 Unspecified ovarian cyst, unspecified side: Secondary | ICD-10-CM | POA: Insufficient documentation

## 2022-06-20 DIAGNOSIS — O99321 Drug use complicating pregnancy, first trimester: Secondary | ICD-10-CM | POA: Insufficient documentation

## 2022-06-20 DIAGNOSIS — O99341 Other mental disorders complicating pregnancy, first trimester: Secondary | ICD-10-CM | POA: Insufficient documentation

## 2022-06-20 DIAGNOSIS — O219 Vomiting of pregnancy, unspecified: Secondary | ICD-10-CM | POA: Insufficient documentation

## 2022-06-20 DIAGNOSIS — O208 Other hemorrhage in early pregnancy: Secondary | ICD-10-CM | POA: Insufficient documentation

## 2022-06-20 DIAGNOSIS — O26891 Other specified pregnancy related conditions, first trimester: Secondary | ICD-10-CM | POA: Insufficient documentation

## 2022-06-20 DIAGNOSIS — F419 Anxiety disorder, unspecified: Secondary | ICD-10-CM | POA: Insufficient documentation

## 2022-06-20 LAB — URINALYSIS, ROUTINE W REFLEX MICROSCOPIC
Bilirubin Urine: NEGATIVE
Glucose, UA: NEGATIVE mg/dL
Hgb urine dipstick: NEGATIVE
Ketones, ur: 5 mg/dL — AB
Leukocytes,Ua: NEGATIVE
Nitrite: NEGATIVE
Protein, ur: NEGATIVE mg/dL
Specific Gravity, Urine: 1.016 (ref 1.005–1.030)
pH: 8 (ref 5.0–8.0)

## 2022-06-20 LAB — CBC
HCT: 38.1 % (ref 36.0–46.0)
Hemoglobin: 12 g/dL (ref 12.0–15.0)
MCH: 26.2 pg (ref 26.0–34.0)
MCHC: 31.5 g/dL (ref 30.0–36.0)
MCV: 83.2 fL (ref 80.0–100.0)
Platelets: 416 10*3/uL — ABNORMAL HIGH (ref 150–400)
RBC: 4.58 MIL/uL (ref 3.87–5.11)
RDW: 15.7 % — ABNORMAL HIGH (ref 11.5–15.5)
WBC: 8.6 10*3/uL (ref 4.0–10.5)
nRBC: 0 % (ref 0.0–0.2)

## 2022-06-20 LAB — COMPREHENSIVE METABOLIC PANEL
ALT: 11 U/L (ref 0–44)
AST: 16 U/L (ref 15–41)
Albumin: 4.6 g/dL (ref 3.5–5.0)
Alkaline Phosphatase: 47 U/L (ref 38–126)
Anion gap: 10 (ref 5–15)
BUN: 5 mg/dL — ABNORMAL LOW (ref 6–20)
CO2: 21 mmol/L — ABNORMAL LOW (ref 22–32)
Calcium: 9.8 mg/dL (ref 8.9–10.3)
Chloride: 104 mmol/L (ref 98–111)
Creatinine, Ser: 0.73 mg/dL (ref 0.44–1.00)
GFR, Estimated: >60 mL/min (ref >60)
Glucose, Bld: 85 mg/dL (ref 70–99)
Potassium: 3.3 mmol/L — ABNORMAL LOW (ref 3.5–5.1)
Sodium: 135 mmol/L (ref 135–145)
Total Bilirubin: 0.6 mg/dL (ref 0.3–1.2)
Total Protein: 7.7 g/dL (ref 6.5–8.1)

## 2022-06-20 LAB — WET PREP, GENITAL
Clue Cells Wet Prep HPF POC: NONE SEEN
Sperm: NONE SEEN
Trich, Wet Prep: NONE SEEN
WBC, Wet Prep HPF POC: <10 (ref <10)
Yeast Wet Prep HPF POC: NONE SEEN

## 2022-06-20 LAB — HCG, QUANTITATIVE, PREGNANCY: hCG, Beta Chain, Quant, S: 80204 m[IU]/mL — ABNORMAL HIGH (ref <5)

## 2022-06-20 MED ORDER — ACETAMINOPHEN 500 MG PO TABS
1000.0000 mg | ORAL_TABLET | Freq: Once | ORAL | Status: AC
  Administered 2022-06-20: 1000 mg via ORAL
  Filled 2022-06-20: qty 2

## 2022-06-20 MED ORDER — LOPERAMIDE HCL 2 MG PO CAPS
2.0000 mg | ORAL_CAPSULE | Freq: Once | ORAL | Status: AC
  Administered 2022-06-20: 2 mg via ORAL
  Filled 2022-06-20: qty 1

## 2022-06-20 MED ORDER — SODIUM CHLORIDE 0.9 % IV SOLN
12.5000 mg | Freq: Four times a day (QID) | INTRAVENOUS | Status: DC | PRN
  Administered 2022-06-20: 12.5 mg via INTRAVENOUS
  Filled 2022-06-20: qty 0.5

## 2022-06-20 MED ORDER — DOXYLAMINE-PYRIDOXINE 10-10 MG PO TBEC: DELAYED_RELEASE_TABLET | ORAL | 0 refills | Status: DC

## 2022-06-20 MED ORDER — SCOPOLAMINE 1 MG/3DAYS TD PT72
1.0000 | MEDICATED_PATCH | Freq: Once | TRANSDERMAL | Status: DC
  Filled 2022-06-20: qty 1

## 2022-06-20 MED ORDER — LACTATED RINGERS IV BOLUS
1000.0000 mL | Freq: Once | INTRAVENOUS | Status: AC
  Administered 2022-06-20: 1000 mL via INTRAVENOUS

## 2022-06-20 MED ORDER — FAMOTIDINE 10 MG PO TABS: 10.0000 mg | ORAL_TABLET | Freq: Two times a day (BID) | ORAL | 1 refills | Status: DC

## 2022-06-20 MED ORDER — HYDROMORPHONE HCL 1 MG/ML IJ SOLN: 0.5000 mg | Freq: Once | INTRAMUSCULAR | Status: DC

## 2022-06-20 MED ORDER — FAMOTIDINE IN NACL 20-0.9 MG/50ML-% IV SOLN
20.0000 mg | Freq: Once | INTRAVENOUS | Status: AC
  Administered 2022-06-20: 20 mg via INTRAVENOUS
  Filled 2022-06-20: qty 50

## 2022-06-20 MED ORDER — SCOPOLAMINE 1 MG/3DAYS TD PT72
1.0000 | MEDICATED_PATCH | TRANSDERMAL | 12 refills | Status: AC
Start: 2022-06-20 — End: ?

## 2022-06-20 NOTE — MAU Provider Note (Signed)
History     CSN: [id number]  Arrival date and time: 06/20/22 1101   Event Date/Time   First Provider Initiated Contact with Patient 06/20/22 1205      Chief Complaint  Patient presents with   Vaginal Bleeding   Abdominal Pain   Diarrhea   Dizziness   Debbie Stewart, a  26 y.o. G2P0010 at Unknown presents to MAU with complaints of right sided intermittent "stabbing pain" that started yesterday and had gotten increasingly worse. She was recently seen at [facility] ED for similar complaints and was diagnosed with UTI and given a 1 time dose of ABX. Patient pain yesterday was 7/10 today she rates pain a 8-9/10. Patient states she was given PO tylenol yesterday but was unable to keep it down. Denies vaginal or urinary symptoms.   Also complaints of nausea and vomiting  and diarrhea x1 week. Patient states she has not been able to keep anything down since finding out she was pregnancy, but over the last 2 days nausea and vomiting has increased and patient feels light headed and dizzy.    She also endorses vaginal bleeding for the last 2 days. Patient states yesterday she changed 2 tampons and noticed small clots . Patient states after her ED visit yesterday the bleeding stopped, but picked back up this AM. Patient states she is having bright red spotting today with wiping, but denies having to wear a pad today. Denies clots today. [place] yesterday show a normal IUP with cardiac activity at [redacted]w[redacted]d, and multiple types of cysts.        OB History     Gravida  2   Para      Term      Preterm      AB  1   Living         SAB  1   IAB      Ectopic      Multiple      Live Births              Past Medical History:  Diagnosis Date   Anemia    Anxiety    Depression    Ovarian cyst     Past Surgical History:  Procedure Laterality Date   WISDOM TOOTH EXTRACTION      Family History  Problem Relation Age of Onset   Sickle cell trait Mother     Social History   Tobacco Use    Smoking status: Never   Smokeless tobacco: Never  Vaping Use   Vaping Use: Never used  Substance Use Topics   Alcohol use: Not Currently   Drug use: Not Currently    Types: Marijuana    Comment: tired once before pregnancy    Allergies:  Allergies  Allergen Reactions   Shellfish Allergy Anaphylaxis    Medications Prior to Admission  Medication Sig Dispense Refill Last Dose   dicyclomine (BENTYL) 20 MG tablet Take 1 tablet (20 mg total) by mouth 2 (two) times daily. 20 tablet 0    hydrOXYzine (ATARAX) 25 MG tablet Take 1 tablet (25 mg total) by mouth every 8 (eight) hours as needed. 30 tablet 0    naproxen (NAPROSYN) 375 MG tablet Take 1 tablet (375 mg total) by mouth 2 (two) times daily. 20 tablet 0    omeprazole (PRILOSEC) 20 MG capsule Take 1 capsule (20 mg total) by mouth daily for 20 days. 20 capsule 0    ondansetron (ZOFRAN-ODT) 4 MG disintegrating tablet Take 1  tablet (4 mg total) by mouth every 8 (eight) hours as needed for nausea or vomiting. 30 tablet 1     Review of Systems  Constitutional:  Positive for diaphoresis and fatigue. Negative for chills and fever.  Respiratory:  Negative for apnea, shortness of breath and wheezing.   Cardiovascular:  Negative for chest pain and palpitations.  Gastrointestinal:  Positive for abdominal pain, diarrhea, nausea and vomiting.  Genitourinary:  Positive for flank pain, pelvic pain and vaginal bleeding. Negative for dyspareunia, dysuria, vaginal discharge and vaginal pain.  Musculoskeletal:  Positive for back pain.  Neurological:  Positive for dizziness, weakness and light-headedness. Negative for headaches.  Psychiatric/Behavioral:  Negative for suicidal ideas.    Physical Exam   Blood pressure 112/63, pulse 81, temperature 99.1 F (37.3 C), temperature source Oral, resp. rate 20, height 5' 6.5" (1.689 m), weight 73.6 kg, last menstrual period 05/03/2022, SpO2 100 %.  Physical Exam Vitals and nursing note reviewed.   Constitutional:      Appearance: She is ill-appearing and diaphoretic.  Cardiovascular:     Rate and Rhythm: Normal rate and regular rhythm.     Heart sounds: Normal heart sounds.  Pulmonary:     Effort: Pulmonary effort is normal. No respiratory distress.  Abdominal:     Palpations: Abdomen is soft.     Tenderness: There is abdominal tenderness in the right lower quadrant and suprapubic area. There is right CVA tenderness. There is no left CVA tenderness.  Skin:    General: Skin is warm.     Coloration: Skin is pale.  Neurological:     Mental Status: She is alert and oriented to person, place, and time.  Psychiatric:        Behavior: Behavior normal.     MAU Course  Procedures Orders Placed This Encounter  Procedures   Wet prep, genital   US OB Transvaginal   US RENAL   Urinalysis, Routine w reflex microscopic Urine, Clean Catch   CBC   hCG, quantitative, pregnancy   Comprehensive metabolic panel   Diet NPO time specified   Insert peripheral IV   Discharge patient   Results for orders placed or performed during the hospital encounter of 06/20/22 (from the past 24 hour(s))  Wet prep, genital     Status: None   Collection Time: 06/20/22 12:33 PM  Result Value Ref Range   Yeast Wet Prep HPF POC NONE SEEN NONE SEEN   Trich, Wet Prep NONE SEEN NONE SEEN   Clue Cells Wet Prep HPF POC NONE SEEN NONE SEEN   WBC, Wet Prep HPF POC <10 <10   Sperm NONE SEEN   Urinalysis, Routine w reflex microscopic     Status: Abnormal   Collection Time: 06/20/22 12:36 PM  Result Value Ref Range   Color, Urine YELLOW YELLOW   APPearance HAZY (A) CLEAR   Specific Gravity, Urine 1.016 1.005 - 1.030   pH 8.0 5.0 - 8.0   Glucose, UA NEGATIVE NEGATIVE mg/dL   Hgb urine dipstick NEGATIVE NEGATIVE   Bilirubin Urine NEGATIVE NEGATIVE   Ketones, ur 5 (A) NEGATIVE mg/dL   Protein, ur NEGATIVE NEGATIVE mg/dL   Nitrite NEGATIVE NEGATIVE   Leukocytes,Ua NEGATIVE NEGATIVE  CBC     Status:  Abnormal   Collection Time: 06/20/22 12:36 PM  Result Value Ref Range   WBC 8.6 4.0 - 10.5 K/uL   RBC 4.58 3.87 - 5.11 MIL/uL   Hemoglobin 12.0 12.0 - 15.0 g/dL   HCT 38.1  36.0 - 46.0 %   MCV 83.2 80.0 - 100.0 fL   MCH 26.2 26.0 - 34.0 pg   MCHC 31.5 30.0 - 36.0 g/dL   RDW 32.4 (H) 40.1 - 02.7 %   Platelets 416 (H) 150 - 400 K/uL   nRBC 0.0 0.0 - 0.2 %  hCG, quantitative, pregnancy     Status: Abnormal   Collection Time: 06/20/22 12:36 PM  Result Value Ref Range   hCG, Beta Chain, Quant, S 80,204 (H) <5 mIU/mL  Comprehensive metabolic panel     Status: Abnormal   Collection Time: 06/20/22 12:36 PM  Result Value Ref Range   Sodium 135 135 - 145 mmol/L   Potassium 3.3 (L) 3.5 - 5.1 mmol/L   Chloride 104 98 - 111 mmol/L   CO2 21 (L) 22 - 32 mmol/L   Glucose, Bld 85 70 - 99 mg/dL   BUN 5 (L) 6 - 20 mg/dL   Creatinine, Ser 2.53 0.44 - 1.00 mg/dL   Calcium 9.8 8.9 - 66.4 mg/dL   Total Protein 7.7 6.5 - 8.1 g/dL   Albumin 4.6 3.5 - 5.0 g/dL   AST 16 15 - 41 U/L   ALT 11 0 - 44 U/L   Alkaline Phosphatase 47 38 - 126 U/L   Total Bilirubin 0.6 0.3 - 1.2 mg/dL   GFR, Estimated >40 >34 mL/min   Anion gap 10 5 - 15   Meds ordered this encounter  Medications   lactated ringers bolus 1,000 mL   promethazine (PHENERGAN) 12.5 mg in sodium chloride 0.9 % 50 mL IVPB   famotidine (PEPCID) IVPB 20 mg premix   DISCONTD: HYDROmorphone (DILAUDID) injection 0.5 mg   acetaminophen (TYLENOL) tablet 1,000 mg   loperamide (IMODIUM) capsule 2 mg   Doxylamine-Pyridoxine 10-10 MG TBEC    Sig: Take 2 tabs at bedtime. If needed, add another tab in the morning. If needed, add another tab in the afternoon, up to 4 tabs/day.    Dispense:  120 tablet    Refill:  0    DAW9    Order Specific Question:   Supervising Provider    Answer:   Reva Bores [[id number]]   famotidine (PEPCID) 10 MG tablet    Sig: Take 1 tablet (10 mg total) by mouth 2 (two) times daily.    Dispense:  30 tablet    Refill:  1     Order Specific Question:   Supervising Provider    Answer:   Reva Bores [[id number]]   scopolamine (TRANSDERM-SCOP) 1 MG/3DAYS 1.5 mg   scopolamine (TRANSDERM-SCOP) 1 MG/3DAYS    Sig: Place 1 patch (1.5 mg total) onto the skin every 3 (three) days.    Dispense:  10 patch    Refill:  12    Order Specific Question:   Supervising Provider    Answer:   Samara Snide    US RENAL  Result Date: 06/20/2022 CLINICAL DATA:  CVA tenderness, pregnancy EXAM: RENAL / URINARY TRACT ULTRASOUND COMPLETE COMPARISON:  None FINDINGS: Right Kidney: Renal measurements: 11.5 x 4.9 x 5.8 cm = volume: 173 mL. Normal cortical thickness and echogenicity. No mass, hydronephrosis or shadowing calcification. Left Kidney: Renal measurements: 11.8 x 5.3 x 6.7 cm = volume: 217 mL. Normal cortical thickness and echogenicity. No mass, hydronephrosis or shadowing calcification. Bladder: Appears normal for degree of bladder distention. Other: N/A IMPRESSION: Normal renal ultrasound. Electronically Signed   By: Ulyses Southward M.D.   On: 06/20/2022 14:37  US OB Transvaginal  Result Date: 06/20/2022 CLINICAL DATA:  Vaginal bleeding, first trimester pregnancy EXAM: TRANSVAGINAL OB ULTRASOUND TECHNIQUE: Transvaginal ultrasound was performed for complete evaluation of the gestation as well as the maternal uterus, adnexal regions, and pelvic cul-de-sac. COMPARISON:  06/19/2022 FINDINGS: Intrauterine gestational sac: Single Yolk sac:  Seen Embryo:  Seen Cardiac Activity: Seen Heart Rate: 124 bpm CRL:   7.71 mm   6 w 5 d                  [place] EDC: 02/08/2023 Subchorionic hemorrhage: There is small 1.2 x 0.4 cm linear hypoechoic structure inferior to the gestational sac suggesting small subchorionic bleed. Maternal uterus/adnexae: There is 6.9 x 2.8 cm simple appearing cyst in the right adnexa, possibly functional right ovarian cyst. Small amount of free fluid is seen in pelvis. IMPRESSION: There is a single live intrauterine pregnancy  sonographically estimated gestational age is 6 weeks 5 days. There is small linear 1.2 x 0.4 cm hypoechoic structure adjacent to the gestational sac suggesting small subchorionic bleed. 6.9 x 2.8 cm simple appearing cyst in the right adnexa suggests functional right ovarian cyst. Electronically Signed   By: Ernie Avena M.D.   On: 06/20/2022 13:47     MDM Lab results reviewed and interpreted by me.   - Patient has a known UTI treated with ABX yesterday.  - Low white count and patient afebrile, with normal vital signs.  - Low suspicion for infection.  - Symptoms improved with IV fluids, Pepcid, phenergan, PO tylenol and imodium.  - Imagining from yesterday day indicated multiple cysts. Imaging today suggest 1 simple ovarian cysts. Today [place] also notes a small amount of free fluid in the pelvis.  - Clinical picture today suggestive of ruptured ovarian cysts.  - Low suspicion for ovarian torsion.  - Small Subchorionic hemorrhage noted on exam.     - Reassessment prior to discharge patient actively vomiting again. Scop patch prescribed.  Assessment and Plan   1. Subchorionic hemorrhage of placenta in first trimester, single or unspecified fetus   2. [redacted] weeks gestation of pregnancy   3. Ruptured ovarian cyst   4. Nausea and vomiting in pregnancy   5. Diarrhea, unspecified type   - Discussed that symptoms could be related to possible ruptured ovarian cyst.  - Ovarian torsion precautions provided.  - Subchorionic hemorrhage bleeding expectations and precautions provided.  - Antiemetic Rx sent to outpatient pharmacy for nausea and vomiting.  -  List of providers for [facility] provided.  - Early pregnancy precautions reviewed.  - Patient discharged home in stable condition and may return to MAU as needed.    Claudette Head, MSN CNM  06/20/2022, 5:46 PM

## 2022-06-20 NOTE — Discharge Instructions (Signed)
Prenatal Care Providers           [facility]  [address] [phone]  [facility]   [address]  [phone]  [facility] [place]       [address] [phone]            [facility] [place]     [address] [phone]          [facility] @ [place]   [address] [phone]  [facility]  [address] [phone]     [facility])  [address]   [phone]     [facility]  Phone: [phone]  [facility]  Phone: [phone]  [facility] Phone: [phone]  [facility] Phone: [phone]  [facility] Phone: [phone]  [facility] Phone: [phone]  [facility]  Phone: [phone] Safe Medications in Pregnancy   Acne: Benzoyl Peroxide Salicylic Acid  Backache/Headache: Tylenol: 2 regular strength every 4 hours OR              2 Extra strength every 6 hours  Colds/Coughs/Allergies: Benadryl (alcohol free) 25 mg every 6 hours as needed Breath right strips Claritin Cepacol throat lozenges Chloraseptic throat spray Cold-Eeze- up to three times per day Cough drops, alcohol free Flonase (by prescription only) Guaifenesin Mucinex Robitussin DM (plain only, alcohol free) Saline nasal spray/drops Sudafed (pseudoephedrine) & Actifed ** use only after [redacted] weeks gestation and if you do not have high blood pressure Tylenol Vicks Vaporub Zinc lozenges Zyrtec   Constipation: Colace Ducolax suppositories Fleet enema Glycerin suppositories Metamucil Milk of magnesia Miralax Senokot Smooth move tea  Diarrhea: Kaopectate Imodium A-D  *NO pepto  Bismol  Hemorrhoids: Anusol Anusol HC Preparation H Tucks  Indigestion: Tums Maalox Mylanta Zantac  Pepcid  Insomnia: Benadryl (alcohol free) 25mg every 6 hours as needed Tylenol PM Unisom, no Gelcaps  Leg Cramps: Tums MagGel  Nausea/Vomiting:  Bonine Dramamine Emetrol Ginger extract Sea bands Meclizine  Nausea medication to take during pregnancy:  Unisom (doxylamine succinate 25 mg tablets) Take one tablet daily at bedtime. If symptoms are not adequately controlled, the dose can be increased to a maximum recommended dose of two tablets daily (1/2 tablet in the morning, 1/2 tablet mid-afternoon and one at bedtime). Vitamin B6 100mg tablets. Take one tablet twice a day (up to 200 mg per day).  Skin Rashes: Aveeno products Benadryl cream or 25mg every 6 hours as needed Calamine Lotion 1% cortisone cream  Yeast infection: Gyne-lotrimin 7 Monistat 7   **If taking multiple medications, please check labels to avoid duplicating the same active ingredients **take medication as directed on the label ** Do not exceed 4000 mg of tylenol in 24 hours **Do not take medications that contain aspirin or ibuprofen    

## 2022-06-20 NOTE — MAU Note (Signed)
Debbie Stewart is a 26 y.o. at Unknown here in MAU reporting: started bleeding and cramping on Sunday. Passed a clot on Sunday.  Went to [employer] yesterday.  Viable IUP, cysts noted on both ovaries.   Threw up a lot yesterday.  Unable to keep food down.  Is just spotting, bright red early this morning.  Pain is worse, stabbing and cramping in lower abd, esp rt side.  Pt is dizzy and diaphoretic.  Watery stools started 4 days ago.  Onset of complaint: original cramping started 1-2wks ago, first bleeding on Sunday. Pain score: 8 Vitals:   06/20/22 1121 06/20/22 1128  Pulse:  86  Resp:  20  Temp:  98.8 F (37.1 C)  SpO2: 97% 100%      Lab orders placed from triage:  UA Unable to get BP due to retching

## 2022-06-21 LAB — GC/CHLAMYDIA PROBE AMP (~~LOC~~) NOT AT ARMC
Chlamydia: NEGATIVE
Comment: NEGATIVE
Comment: NORMAL
Neisseria Gonorrhea: NEGATIVE

## 2022-07-05 ENCOUNTER — Encounter (HOSPITAL_COMMUNITY): Payer: Self-pay | Admitting: Obstetrics and Gynecology

## 2022-07-05 ENCOUNTER — Inpatient Hospital Stay (HOSPITAL_COMMUNITY)
Admission: AD | Admit: 2022-07-05 | Discharge: 2022-07-05 | Disposition: A | Discharge status: Home / Self Care | Payer: Self-pay | Attending: Obstetrics and Gynecology | Admitting: Obstetrics and Gynecology

## 2022-07-05 DIAGNOSIS — R350 Frequency of micturition: Secondary | ICD-10-CM | POA: Insufficient documentation

## 2022-07-05 DIAGNOSIS — R3915 Urgency of urination: Secondary | ICD-10-CM | POA: Insufficient documentation

## 2022-07-05 DIAGNOSIS — Z6790 Unspecified blood type, Rh positive: Secondary | ICD-10-CM

## 2022-07-05 DIAGNOSIS — O039 Complete or unspecified spontaneous abortion without complication: Secondary | ICD-10-CM | POA: Insufficient documentation

## 2022-07-05 LAB — CBC
HCT: 31.7 % — ABNORMAL LOW (ref 36.0–46.0)
Hemoglobin: 10.6 g/dL — ABNORMAL LOW (ref 12.0–15.0)
MCH: 27.4 pg (ref 26.0–34.0)
MCHC: 33.4 g/dL (ref 30.0–36.0)
MCV: 81.9 fL (ref 80.0–100.0)
Platelets: 357 10*3/uL (ref 150–400)
RBC: 3.87 MIL/uL (ref 3.87–5.11)
RDW: 15.9 % — ABNORMAL HIGH (ref 11.5–15.5)
WBC: 8.1 10*3/uL (ref 4.0–10.5)
nRBC: 0 % (ref 0.0–0.2)

## 2022-07-05 LAB — URINALYSIS, ROUTINE W REFLEX MICROSCOPIC
Bilirubin Urine: NEGATIVE
Glucose, UA: NEGATIVE mg/dL
Hgb urine dipstick: NEGATIVE
Ketones, ur: NEGATIVE mg/dL
Leukocytes,Ua: NEGATIVE
Nitrite: NEGATIVE
Protein, ur: NEGATIVE mg/dL
Specific Gravity, Urine: 1.025 (ref 1.005–1.030)
pH: 5 (ref 5.0–8.0)

## 2022-07-05 MED ORDER — ONDANSETRON 4 MG PO TBDP: 4.0000 mg | ORAL_TABLET | Freq: Three times a day (TID) | ORAL | 0 refills | Status: DC | PRN

## 2022-07-05 MED ORDER — KETOROLAC TROMETHAMINE 30 MG/ML IJ SOLN
30.0000 mg | Freq: Once | INTRAMUSCULAR | Status: AC
  Administered 2022-07-05: 30 mg via INTRAVENOUS
  Filled 2022-07-05: qty 1

## 2022-07-05 MED ORDER — SODIUM CHLORIDE 0.9 % IV SOLN
25.0000 mg | Freq: Once | INTRAVENOUS | Status: AC
  Administered 2022-07-05: 25 mg via INTRAVENOUS
  Filled 2022-07-05: qty 1

## 2022-07-05 MED ORDER — KETOROLAC TROMETHAMINE 10 MG PO TABS
10.0000 mg | ORAL_TABLET | Freq: Four times a day (QID) | ORAL | 0 refills | Status: DC | PRN
Start: 2022-07-05 — End: 2023-06-12

## 2022-07-05 MED ORDER — LORAZEPAM 2 MG/ML IJ SOLN
0.5000 mg | Freq: Once | INTRAMUSCULAR | Status: AC
Start: 2022-07-05 — End: 2022-07-05
  Administered 2022-07-05: 0.5 mg via INTRAVENOUS
  Filled 2022-07-05: qty 1

## 2022-07-05 MED ORDER — HYDROMORPHONE HCL 1 MG/ML IJ SOLN
1.0000 mg | INTRAMUSCULAR | Status: DC | PRN
  Administered 2022-07-05: 1 mg via INTRAVENOUS
  Filled 2022-07-05: qty 1

## 2022-07-05 MED ORDER — LACTATED RINGERS IV BOLUS
1000.0000 mL | Freq: Once | INTRAVENOUS | Status: AC
  Administered 2022-07-05: 1000 mL via INTRAVENOUS

## 2022-07-05 MED ORDER — HYDROXYZINE HCL 25 MG PO TABS
25.0000 mg | ORAL_TABLET | Freq: Three times a day (TID) | ORAL | Status: DC | PRN
  Filled 2022-07-05: qty 1

## 2022-07-05 NOTE — MAU Note (Signed)
Patient pressed call bell requesting for staff to come to bedside to check on her bleeding. Marcelo Baldy went into room to change patient's pad when she noticed patient had passed POC. Donette Larry, CNM notified and to bedside.

## 2022-07-05 NOTE — MAU Provider Note (Signed)
History     CSN: [id number]  Arrival date and time: 07/05/22 1441   Event Date/Time   First Provider Initiated Contact with Patient 07/05/22 1536      Chief Complaint  Patient presents with   Abdominal Pain   Vaginal Bleeding   26 y.o. G2P0010 @[redacted]w[redacted]d  with confirmed IUP presenting with cramping. Reports onset last night. Pain is intermittent q15 lasting 2-3 minutes. Rates pain 8/10. Has not treated it. Describes as severe period cramps. Reports some spotting since last night. Endorses urinary urgency and frequency today.    OB History     Gravida  2   Para      Term      Preterm      AB  1   Living         SAB  1   IAB      Ectopic      Multiple      Live Births              Past Medical History:  Diagnosis Date   Anemia    Anxiety    Depression    Ovarian cyst     Past Surgical History:  Procedure Laterality Date   WISDOM TOOTH EXTRACTION      Family History  Problem Relation Age of Onset   Sickle cell trait Mother     Social History   Tobacco Use   Smoking status: Never   Smokeless tobacco: Never  Vaping Use   Vaping Use: Never used  Substance Use Topics   Alcohol use: Not Currently   Drug use: Not Currently    Types: Marijuana    Comment: tired once before pregnancy    Allergies:  Allergies  Allergen Reactions   Shellfish Allergy Anaphylaxis    Medications Prior to Admission  Medication Sig Dispense Refill Last Dose   scopolamine (TRANSDERM-SCOP) 1 MG/3DAYS Place 1 patch (1.5 mg total) onto the skin every 3 (three) days. 10 patch 12 07/05/2022   [DISCONTINUED] ondansetron (ZOFRAN-ODT) 4 MG disintegrating tablet Take 1 tablet (4 mg total) by mouth every 8 (eight) hours as needed for nausea or vomiting. 30 tablet 1 07/05/2022   dicyclomine (BENTYL) 20 MG tablet Take 1 tablet (20 mg total) by mouth 2 (two) times daily. 20 tablet 0    Doxylamine-Pyridoxine 10-10 MG TBEC Take 2 tabs at bedtime. If needed, add another tab in the  morning. If needed, add another tab in the afternoon, up to 4 tabs/day. 120 tablet 0    famotidine (PEPCID) 10 MG tablet Take 1 tablet (10 mg total) by mouth 2 (two) times daily. 30 tablet 1    hydrOXYzine (ATARAX) 25 MG tablet Take 1 tablet (25 mg total) by mouth every 8 (eight) hours as needed. 30 tablet 0    naproxen (NAPROSYN) 375 MG tablet Take 1 tablet (375 mg total) by mouth 2 (two) times daily. 20 tablet 0    omeprazole (PRILOSEC) 20 MG capsule Take 1 capsule (20 mg total) by mouth daily for 20 days. 20 capsule 0     Review of Systems  Constitutional:  Negative for fever.  Gastrointestinal:  Positive for abdominal pain.  Genitourinary:  Positive for frequency, urgency and vaginal bleeding. Negative for dysuria.   Physical Exam   Blood pressure 130/75, pulse 84, temperature 99.4 F (37.4 C), temperature source Oral, resp. rate (!) 22, height 5' 6.5" (1.689 m), weight 72 kg, last menstrual period 05/03/2022, SpO2 99 %.  Physical  Exam Vitals and nursing note reviewed. Exam conducted with a chaperone present.  Constitutional:      General: Debbie Stewart is in acute distress (grimicing).     Appearance: Normal appearance.  HENT:     Head: Normocephalic and atraumatic.  Cardiovascular:     Rate and Rhythm: Normal rate.  Pulmonary:     Effort: Pulmonary effort is normal. No respiratory distress.  Abdominal:     General: There is no distension.     Palpations: Abdomen is soft. There is no mass.     Tenderness: There is abdominal tenderness in the suprapubic area. There is no guarding or rebound.     Hernia: No hernia is present.  Genitourinary:    Comments: VE: closed/long; bleeding scant Musculoskeletal:        General: Normal range of motion.     Cervical back: Normal range of motion.  Skin:    General: Skin is warm and dry.  Neurological:     General: No focal deficit present.     Mental Status: Debbie Stewart is alert and oriented to person, place, and time.  Psychiatric:        Mood and  Affect: Mood normal.        Behavior: Behavior normal.   Limited bedside US: viable, active fetus, FHR 169, subj. nml AFV  Results for orders placed or performed during the hospital encounter of 07/05/22 (from the past 24 hour(s))  Urinalysis, Routine w reflex microscopic Urine, Clean Catch     Status: Abnormal   Collection Time: 07/05/22  3:32 PM  Result Value Ref Range   Color, Urine YELLOW YELLOW   APPearance HAZY (A) CLEAR   Specific Gravity, Urine 1.025 1.005 - 1.030   pH 5.0 5.0 - 8.0   Glucose, UA NEGATIVE NEGATIVE mg/dL   Hgb urine dipstick NEGATIVE NEGATIVE   Bilirubin Urine NEGATIVE NEGATIVE   Ketones, ur NEGATIVE NEGATIVE mg/dL   Protein, ur NEGATIVE NEGATIVE mg/dL   Nitrite NEGATIVE NEGATIVE   Leukocytes,Ua NEGATIVE NEGATIVE  CBC     Status: Abnormal   Collection Time: 07/05/22  3:57 PM  Result Value Ref Range   WBC 8.1 4.0 - 10.5 K/uL   RBC 3.87 3.87 - 5.11 MIL/uL   Hemoglobin 10.6 (L) 12.0 - 15.0 g/dL   HCT 81.8 (L) 56.3 - 14.9 %   MCV 81.9 80.0 - 100.0 fL   MCH 27.4 26.0 - 34.0 pg   MCHC 33.4 30.0 - 36.0 g/dL   RDW 70.2 (H) 63.7 - 85.8 %   Platelets 357 150 - 400 K/uL   nRBC 0.0 0.0 - 0.2 %    MAU Course  Procedures LR Dilaudid Phenergan  MDM Labs ordered and reviewed.  1730: Continues to have pain after Dilaudid. VB increased and pt passed POC (intact GS and embryo). Discussed findings with pt. Pt very anxious and tearful. Ativan and more pain meds ordered. 1820: Continues to have cramping but feeling more relaxed. Toradol ordered. Discussed complete SAB and recommend office f/u in 2 weeks. Offered Anora and pt accepts.  Assessment and Plan   1. SAB (spontaneous abortion)   2. Blood type, Rh positive    Discharge home Follow up at [facility] in 2 weeks-message sent Bleeding/return precautions Rx Toradol Rx Zofran  Allergies as of 07/05/2022       Reactions   Shellfish Allergy Anaphylaxis        Medication List     STOP taking these  medications    dicyclomine  20 MG tablet Commonly known as: BENTYL   Doxylamine-Pyridoxine 10-10 MG Tbec   naproxen 375 MG tablet Commonly known as: NAPROSYN       TAKE these medications    famotidine 10 MG tablet Commonly known as: PEPCID Take 1 tablet (10 mg total) by mouth 2 (two) times daily.   hydrOXYzine 25 MG tablet Commonly known as: ATARAX Take 1 tablet (25 mg total) by mouth every 8 (eight) hours as needed.   ketorolac 10 MG tablet Commonly known as: TORADOL Take 1 tablet (10 mg total) by mouth every 6 (six) hours as needed.   omeprazole 20 MG capsule Commonly known as: PRILOSEC Take 1 capsule (20 mg total) by mouth daily for 20 days.   ondansetron 4 MG disintegrating tablet Commonly known as: ZOFRAN-ODT Take 1 tablet (4 mg total) by mouth every 8 (eight) hours as needed for nausea or vomiting.   scopolamine 1 MG/3DAYS Commonly known as: TRANSDERM-SCOP Place 1 patch (1.5 mg total) onto the skin every 3 (three) days.        Donette Larry, CNM 07/05/2022, 7:03 PM

## 2022-07-05 NOTE — MAU Note (Signed)
.  Debbie Stewart is a 26 y.o. at [redacted]w[redacted]d here in MAU reporting: intermittent lower abdominal stabbing pain (8/10) that has been coming and going since she was last in the hospital and has gotten more intense this morning. She has also started having small amount of bleeding last night into today that is intermittent. Previously diagnosed with subchorionic hemorrhage. Denies any abnormal vaginal discharge. LMP: N/A Onset of complaint: ON-going Pain score: 8/10 Vitals:   07/05/22 1519  BP: 115/76  Pulse: 82  Resp: (!) 22  Temp: 99.4 F (37.4 C)     FHT:N/A Lab orders placed from triage:  UA

## 2022-07-05 NOTE — MAU Note (Signed)
Anora kit completed and given to [facility].

## 2022-07-05 NOTE — MAU Note (Signed)
Patient did not sign AVS due to E-signature malfunction. Patient verbalized understanding of discharge instructions and when to return to the hospital.

## 2022-07-07 LAB — SURGICAL PATHOLOGY

## 2022-07-20 ENCOUNTER — Encounter: Payer: Self-pay | Admitting: Family Medicine

## 2022-07-31 ENCOUNTER — Telehealth: Payer: Self-pay | Admitting: *Deleted

## 2022-07-31 ENCOUNTER — Encounter: Payer: Self-pay | Admitting: *Deleted

## 2022-07-31 NOTE — Telephone Encounter (Signed)
Anora results received. I called Jesicca to discuss and reached voicemail. I left a message I was calling with results and she may call us back, or we will call her back. Results scanned to chart. Staci Acosta

## 2022-08-04 NOTE — Telephone Encounter (Signed)
Called pt; results reviewed. 

## 2022-08-20 ENCOUNTER — Emergency Department (HOSPITAL_COMMUNITY): Payer: BC Managed Care – PPO

## 2022-08-20 ENCOUNTER — Emergency Department (HOSPITAL_COMMUNITY)
Admission: EM | Admit: 2022-08-20 | Discharge: 2022-08-20 | Disposition: A | Discharge status: Home / Self Care | Payer: BC Managed Care – PPO | Attending: Emergency Medicine | Admitting: Emergency Medicine

## 2022-08-20 ENCOUNTER — Encounter (HOSPITAL_COMMUNITY): Payer: Self-pay

## 2022-08-20 ENCOUNTER — Other Ambulatory Visit: Payer: Self-pay

## 2022-08-20 DIAGNOSIS — R569 Unspecified convulsions: Secondary | ICD-10-CM | POA: Insufficient documentation

## 2022-08-20 DIAGNOSIS — R519 Headache, unspecified: Secondary | ICD-10-CM | POA: Insufficient documentation

## 2022-08-20 DIAGNOSIS — E876 Hypokalemia: Secondary | ICD-10-CM | POA: Diagnosis not present

## 2022-08-20 LAB — CBC WITH DIFFERENTIAL/PLATELET
Abs Immature Granulocytes: 0.01 10*3/uL (ref 0.00–0.07)
Basophils Absolute: 0 10*3/uL (ref 0.0–0.1)
Basophils Relative: 0 %
Eosinophils Absolute: 0.1 10*3/uL (ref 0.0–0.5)
Eosinophils Relative: 1 %
HCT: 31.3 % — ABNORMAL LOW (ref 36.0–46.0)
Hemoglobin: 9.4 g/dL — ABNORMAL LOW (ref 12.0–15.0)
Immature Granulocytes: 0 %
Lymphocytes Relative: 44 %
Lymphs Abs: 2.6 10*3/uL (ref 0.7–4.0)
MCH: 25.5 pg — ABNORMAL LOW (ref 26.0–34.0)
MCHC: 30 g/dL (ref 30.0–36.0)
MCV: 84.8 fL (ref 80.0–100.0)
Monocytes Absolute: 0.4 10*3/uL (ref 0.1–1.0)
Monocytes Relative: 6 %
Neutro Abs: 2.9 10*3/uL (ref 1.7–7.7)
Neutrophils Relative %: 49 %
Platelets: 433 10*3/uL — ABNORMAL HIGH (ref 150–400)
RBC: 3.69 MIL/uL — ABNORMAL LOW (ref 3.87–5.11)
RDW: 15.7 % — ABNORMAL HIGH (ref 11.5–15.5)
WBC: 5.9 10*3/uL (ref 4.0–10.5)
nRBC: 0 % (ref 0.0–0.2)

## 2022-08-20 LAB — I-STAT BETA HCG BLOOD, ED (MC, WL, AP ONLY): I-stat hCG, quantitative: 14.5 m[IU]/mL — ABNORMAL HIGH (ref <5)

## 2022-08-20 LAB — COMPREHENSIVE METABOLIC PANEL
ALT: 15 U/L (ref 0–44)
AST: 30 U/L (ref 15–41)
Albumin: 3.9 g/dL (ref 3.5–5.0)
Alkaline Phosphatase: 51 U/L (ref 38–126)
Anion gap: 8 (ref 5–15)
BUN: <5 mg/dL — ABNORMAL LOW (ref 6–20)
CO2: 23 mmol/L (ref 22–32)
Calcium: 8.9 mg/dL (ref 8.9–10.3)
Chloride: 105 mmol/L (ref 98–111)
Creatinine, Ser: 0.85 mg/dL (ref 0.44–1.00)
GFR, Estimated: >60 mL/min (ref >60)
Glucose, Bld: 115 mg/dL — ABNORMAL HIGH (ref 70–99)
Potassium: 2.9 mmol/L — ABNORMAL LOW (ref 3.5–5.1)
Sodium: 136 mmol/L (ref 135–145)
Total Bilirubin: 0.7 mg/dL (ref 0.3–1.2)
Total Protein: 6.8 g/dL (ref 6.5–8.1)

## 2022-08-20 LAB — MAGNESIUM: Magnesium: 1.9 mg/dL (ref 1.7–2.4)

## 2022-08-20 LAB — HCG, QUANTITATIVE, PREGNANCY: hCG, Beta Chain, Quant, S: 20 m[IU]/mL — ABNORMAL HIGH (ref <5)

## 2022-08-20 MED ORDER — ACETAMINOPHEN 500 MG PO TABS
1000.0000 mg | ORAL_TABLET | Freq: Once | ORAL | Status: AC
  Administered 2022-08-20: 1000 mg via ORAL
  Filled 2022-08-20: qty 2

## 2022-08-20 MED ORDER — LACTATED RINGERS IV BOLUS: 1000.0000 mL | Freq: Once | INTRAVENOUS | Status: DC

## 2022-08-20 MED ORDER — LACTATED RINGERS IV BOLUS
1000.0000 mL | Freq: Once | INTRAVENOUS | Status: AC
  Administered 2022-08-20: 1000 mL via INTRAVENOUS

## 2022-08-20 NOTE — ED Notes (Signed)
Patient transported to CT scan . 

## 2022-08-20 NOTE — ED Triage Notes (Signed)
Pt BIB EMS due to a seizure. [place] and c/o of a headache. Pt had a seizure witnessed by friends that lasted 1 min. EMS states pt was post ictal when they arrived. Pt is axox4 on arrival. VSS. Pt has hx of seizures, last one was 2 years ago. Pt does not take meds for it.

## 2022-08-20 NOTE — ED Provider Notes (Signed)
  [place] EMERGENCY DEPARTMENT Provider Note   CSN: [id number] Arrival date & time: 08/20/22  1729     History {Add pertinent medical, surgical, social history, OB history to HPI:1} Chief Complaint  Patient presents with   Seizures    Debbie Stewart is a 26 y.o. female.   Seizures      Home Medications Prior to Admission medications   Medication Sig Start Date End Date Taking? Authorizing Provider  famotidine (PEPCID) 10 MG tablet Take 1 tablet (10 mg total) by mouth 2 (two) times daily. 06/20/22   Deloris Ping, CNM  hydrOXYzine (ATARAX) 25 MG tablet Take 1 tablet (25 mg total) by mouth every 8 (eight) hours as needed. 03/19/22   Evlyn Courier, PA-C  ketorolac (TORADOL) 10 MG tablet Take 1 tablet (10 mg total) by mouth every 6 (six) hours as needed. 07/05/22   Julianne Handler, CNM  omeprazole (PRILOSEC) 20 MG capsule Take 1 capsule (20 mg total) by mouth daily for 20 days. 07/27/21 08/16/21  Luna Fuse, MD  ondansetron (ZOFRAN-ODT) 4 MG disintegrating tablet Take 1 tablet (4 mg total) by mouth every 8 (eight) hours as needed for nausea or vomiting. 07/05/22   Julianne Handler, CNM  scopolamine (TRANSDERM-SCOP) 1 MG/3DAYS Place 1 patch (1.5 mg total) onto the skin every 3 (three) days. 06/20/22   Deloris Ping, CNM      Allergies    Shellfish allergy    Review of Systems   Review of Systems  Neurological:  Positive for seizures.    Physical Exam Updated Vital Signs BP (!) 106/95   Pulse 80   Temp 98.2 F (36.8 C) (Oral)   Resp 15   Ht 5\' 7"  (1.702 m)   Wt 72.6 kg   LMP 08/20/2022   SpO2 91%   Breastfeeding Unknown   BMI 25.06 kg/m  Physical Exam  ED Results / Procedures / Treatments   Labs (all labs ordered are listed, but only abnormal results are displayed) Labs Reviewed - No data to display  EKG None  Radiology No results found.  Procedures Procedures  {Document cardiac monitor, telemetry assessment procedure when  appropriate:1}  Medications Ordered in ED Medications - No data to display  ED Course/ Medical Decision Making/ A&P                           Medical Decision Making  ***  {Document critical care time when appropriate:1} {Document review of labs and clinical decision tools ie heart score, Chads2Vasc2 etc:1}  {Document your independent review of radiology images, and any outside records:1} {Document your discussion with family members, caretakers, and with consultants:1} {Document social determinants of health affecting pt's care:1} {Document your decision making why or why not admission, treatments were needed:1} Final Clinical Impression(s) / ED Diagnoses Final diagnoses:  None    Rx / DC Orders ED Discharge Orders     None

## 2022-08-20 NOTE — Discharge Instructions (Addendum)
Return to the emergency department if you develop any new or worsening symptoms, additional seizure-like activity, nausea, vomiting, loss consciousness.  Your blood pregnancy test was positive but very low, likely a false positive.  I suggest  take an additional home pregnancy test next week .  A referral was sent for neurology, please schedule an appointment at your earliest available appointment.  Please do not drive until you can see the neurology doctors.

## 2022-08-29 ENCOUNTER — Encounter: Payer: Self-pay | Admitting: Neurology

## 2022-08-30 ENCOUNTER — Encounter: Payer: BC Managed Care – PPO | Admitting: Internal Medicine

## 2022-08-30 NOTE — Progress Notes (Deleted)
Follow-up.  She was seen October 22 due to concern for grand mal seizure.  Had a witnessed grand mal seizure that lasted about 1 minute.  She was sitting on a park bench when she began to have seizure-like activity.  Initially postictal but mental status improved in route to the ED.  Seizure-like activity Follow-up with neurology, she is not currently taking any antiseizure medications

## 2022-10-04 ENCOUNTER — Ambulatory Visit: Payer: BC Managed Care – PPO | Admitting: Neurology

## 2022-10-05 ENCOUNTER — Encounter: Payer: Self-pay | Admitting: Neurology

## 2022-11-07 ENCOUNTER — Encounter (HOSPITAL_BASED_OUTPATIENT_CLINIC_OR_DEPARTMENT_OTHER): Payer: Self-pay | Admitting: Emergency Medicine

## 2022-11-07 ENCOUNTER — Other Ambulatory Visit: Payer: Self-pay

## 2022-11-07 ENCOUNTER — Emergency Department (HOSPITAL_BASED_OUTPATIENT_CLINIC_OR_DEPARTMENT_OTHER)
Admission: EM | Admit: 2022-11-07 | Discharge: 2022-11-08 | Disposition: A | Discharge status: Home / Self Care | Payer: BC Managed Care – PPO | Attending: Emergency Medicine | Admitting: Emergency Medicine

## 2022-11-07 DIAGNOSIS — U071 COVID-19: Secondary | ICD-10-CM | POA: Diagnosis not present

## 2022-11-07 DIAGNOSIS — J45909 Unspecified asthma, uncomplicated: Secondary | ICD-10-CM | POA: Insufficient documentation

## 2022-11-07 DIAGNOSIS — R059 Cough, unspecified: Secondary | ICD-10-CM | POA: Diagnosis present

## 2022-11-07 HISTORY — DX: Unspecified asthma, uncomplicated: J45.909

## 2022-11-07 MED ORDER — ACETAMINOPHEN 325 MG PO TABS
650.0000 mg | ORAL_TABLET | Freq: Once | ORAL | Status: AC
  Administered 2022-11-07: 650 mg via ORAL
  Filled 2022-11-07: qty 2

## 2022-11-07 NOTE — ED Triage Notes (Signed)
Pt c/o cough, N/V/D, fever x 1 week.

## 2022-11-08 LAB — RESP PANEL BY RT-PCR (RSV, FLU A&B, COVID)  RVPGX2
Influenza A by PCR: NEGATIVE
Influenza B by PCR: NEGATIVE
Resp Syncytial Virus by PCR: NEGATIVE
SARS Coronavirus 2 by RT PCR: POSITIVE — AB

## 2022-11-08 MED ORDER — KETOROLAC TROMETHAMINE 60 MG/2ML IM SOLN
30.0000 mg | Freq: Once | INTRAMUSCULAR | Status: AC
  Administered 2022-11-08: 30 mg via INTRAMUSCULAR
  Filled 2022-11-08: qty 2

## 2022-11-08 MED ORDER — ONDANSETRON 4 MG PO TBDP: 4.0000 mg | ORAL_TABLET | Freq: Three times a day (TID) | ORAL | 0 refills | Status: AC | PRN

## 2022-11-08 MED ORDER — ALBUTEROL SULFATE HFA 108 (90 BASE) MCG/ACT IN AERS
4.0000 | INHALATION_SPRAY | Freq: Once | RESPIRATORY_TRACT | Status: AC
  Administered 2022-11-08: 4 via RESPIRATORY_TRACT
  Filled 2022-11-08: qty 6.7

## 2022-11-08 NOTE — ED Provider Notes (Signed)
[place] EMERGENCY DEPT Provider Note  CSN: [id number] Arrival date & time: 11/07/22 2242  Chief Complaint(s) Cough  HPI Debbie Stewart is a 27 y.o. female     Cough Cough characteristics:  Non-productive Severity:  Moderate Onset quality:  Gradual Duration:  4 days Timing:  Intermittent Progression:  Waxing and waning Chronicity:  New Context: upper respiratory infection   Relieved by:  Nothing Worsened by:  Deep breathing Ineffective treatments:  Beta-agonist inhaler Associated symptoms: chills, fever, myalgias, rhinorrhea and sinus congestion     Past Medical History Past Medical History:  Diagnosis Date   Anemia    Anxiety    Asthma    Depression    Ovarian cyst    There are no problems to display for this patient.  Home Medication(s) Prior to Admission medications   Medication Sig Start Date End Date Taking? Authorizing Provider  ondansetron (ZOFRAN-ODT) 4 MG disintegrating tablet Take 1 tablet (4 mg total) by mouth every 8 (eight) hours as needed for up to 3 days for nausea or vomiting. 11/08/22 11/11/22 Yes Jerriah Ines, Grayce Sessions, MD  famotidine (PEPCID) 10 MG tablet Take 1 tablet (10 mg total) by mouth 2 (two) times daily. 06/20/22   Deloris Ping, CNM  hydrOXYzine (ATARAX) 25 MG tablet Take 1 tablet (25 mg total) by mouth every 8 (eight) hours as needed. 03/19/22   Evlyn Courier, PA-C  ketorolac (TORADOL) 10 MG tablet Take 1 tablet (10 mg total) by mouth every 6 (six) hours as needed. 07/05/22   Julianne Handler, CNM  omeprazole (PRILOSEC) 20 MG capsule Take 1 capsule (20 mg total) by mouth daily for 20 days. 07/27/21 08/16/21  Luna Fuse, MD  scopolamine (TRANSDERM-SCOP) 1 MG/3DAYS Place 1 patch (1.5 mg total) onto the skin every 3 (three) days. 06/20/22   Deloris Ping, CNM                                                                                                                                    Allergies Shellfish  allergy  Review of Systems Review of Systems  Constitutional:  Positive for chills and fever.  HENT:  Positive for rhinorrhea.   Respiratory:  Positive for cough.   Gastrointestinal:  Positive for diarrhea, nausea and vomiting.  Musculoskeletal:  Positive for myalgias.   As noted in HPI  Physical Exam Vital Signs  I have reviewed the triage vital signs BP 112/88   Pulse 99   Temp 100.2 F (37.9 C)   Resp 18   Ht 5\' 7"  (1.702 m)   Wt 72.6 kg   LMP 05/01/2022   SpO2 99%   BMI 25.06 kg/m   Physical Exam Vitals reviewed.  Constitutional:      General: She is not in acute distress.    Appearance: She is well-developed. She is not diaphoretic.  HENT:     Head: Normocephalic and atraumatic.  Right Ear: A middle ear effusion is present. Tympanic membrane is not erythematous.     Left Ear: A middle ear effusion is present. Tympanic membrane is not erythematous.     Nose: Congestion and rhinorrhea present.     Mouth/Throat:     Pharynx: Oropharynx is clear.  Eyes:     General: No scleral icterus.       Right eye: No discharge.        Left eye: No discharge.     Conjunctiva/sclera: Conjunctivae normal.     Pupils: Pupils are equal, round, and reactive to light.  Cardiovascular:     Rate and Rhythm: Normal rate and regular rhythm.     Heart sounds: No murmur heard.    No friction rub. No gallop.  Pulmonary:     Effort: Pulmonary effort is normal. No respiratory distress.     Breath sounds: Normal breath sounds. No stridor. No rales.  Abdominal:     General: There is no distension.     Palpations: Abdomen is soft.     Tenderness: There is no abdominal tenderness.  Musculoskeletal:        General: No tenderness.     Cervical back: Normal range of motion and neck supple.  Skin:    General: Skin is warm and dry.     Findings: No erythema or rash.  Neurological:     Mental Status: She is alert and oriented to person, place, and time.     ED Results and  Treatments Labs (all labs ordered are listed, but only abnormal results are displayed) Labs Reviewed  RESP PANEL BY RT-PCR (RSV, FLU A&B, COVID)  RVPGX2 - Abnormal; Notable for the following components:      Result Value   SARS Coronavirus 2 by RT PCR POSITIVE (*)    All other components within normal limits                                                                                                                         EKG  EKG Interpretation  Date/Time:    Ventricular Rate:    PR Interval:    QRS Duration:   QT Interval:    QTC Calculation:   R Axis:     Text Interpretation:         Radiology No results found.  Medications Ordered in ED Medications  ketorolac (TORADOL) injection 30 mg (has no administration in time range)  acetaminophen (TYLENOL) tablet 650 mg (650 mg Oral Given 11/07/22 2321)  albuterol (VENTOLIN HFA) 108 (90 Base) MCG/ACT inhaler 4 puff (4 puffs Inhalation Given 11/08/22 0125)  Procedures Procedures  (including critical care time)  Medical Decision Making / ED Course   Medical Decision Making Amount and/or Complexity of Data Reviewed Labs: ordered. Decision-making details documented in ED Course.  Risk OTC drugs. Prescription drug management.    Patient presents with viral symptoms for 4-5 days. Decreased oral hydration. Rest of history as above.  Patient appears well. No signs of toxicity, patient is interactive. No hypoxia, tachypnea or other signs of respiratory distress. No sign of clinical dehydration. Lung exam clear. Rest of exam as above.  Most consistent with viral illness   No evidence suggestive of pharyngitis, AOM, PNA, or meningitis.  COVID+  Chest x-ray not indicated at this time.  Discussed symptomatic treatment with the patient and they will follow closely with their PCP.         Final Clinical Impression(s) / ED Diagnoses Final diagnoses:  WGNFA-21 virus infection   The patient appears reasonably screened and/or stabilized for discharge and I doubt any other medical condition or other [facility] requiring further screening, evaluation, or treatment in the ED at this time. I have discussed the findings, Dx and Tx plan with the patient/family who expressed understanding and agree(s) with the plan. Discharge instructions discussed at length. The patient/family was given strict return precautions who verbalized understanding of the instructions. No further questions at time of discharge.  Disposition: Discharge  Condition: Good  ED Discharge Orders          Ordered    ondansetron (ZOFRAN-ODT) 4 MG disintegrating tablet  Every 8 hours PRN        11/08/22 0117             Follow Up: Primary care provider  Call  to schedule an appointment for close follow up           This chart was dictated using voice recognition software.  Despite best efforts to proofread,  errors can occur which can change the documentation meaning.    Fatima Blank, MD 11/08/22 [phone]

## 2022-11-08 NOTE — Discharge Instructions (Addendum)
You may take over-the-counter medicine for symptomatic relief, such as Tylenol, Motrin, TheraFlu, Alka seltzer , black elderberry, etc. Please limit acetaminophen (Tylenol) to 4000 mg and Ibuprofen (Motrin, Advil, etc.) to 2400 mg for a 24hr period. Please note that other over-the-counter medicine may contain acetaminophen or ibuprofen as a component of their ingredients.   

## 2023-02-24 ENCOUNTER — Other Ambulatory Visit: Payer: Self-pay

## 2023-02-24 ENCOUNTER — Emergency Department (HOSPITAL_BASED_OUTPATIENT_CLINIC_OR_DEPARTMENT_OTHER)
Admission: EM | Admit: 2023-02-24 | Discharge: 2023-02-24 | Disposition: A | Discharge status: Home / Self Care | Payer: BC Managed Care – PPO | Attending: Emergency Medicine | Admitting: Emergency Medicine

## 2023-02-24 ENCOUNTER — Emergency Department (HOSPITAL_BASED_OUTPATIENT_CLINIC_OR_DEPARTMENT_OTHER): Payer: BC Managed Care – PPO

## 2023-02-24 ENCOUNTER — Encounter (HOSPITAL_BASED_OUTPATIENT_CLINIC_OR_DEPARTMENT_OTHER): Payer: Self-pay | Admitting: Emergency Medicine

## 2023-02-24 DIAGNOSIS — R1031 Right lower quadrant pain: Secondary | ICD-10-CM | POA: Insufficient documentation

## 2023-02-24 DIAGNOSIS — R112 Nausea with vomiting, unspecified: Secondary | ICD-10-CM

## 2023-02-24 DIAGNOSIS — R42 Dizziness and giddiness: Secondary | ICD-10-CM | POA: Diagnosis not present

## 2023-02-24 DIAGNOSIS — R109 Unspecified abdominal pain: Secondary | ICD-10-CM

## 2023-02-24 LAB — URINALYSIS, ROUTINE W REFLEX MICROSCOPIC
Bacteria, UA: NONE SEEN
Bilirubin Urine: NEGATIVE
Glucose, UA: NEGATIVE mg/dL
Hgb urine dipstick: NEGATIVE
Leukocytes,Ua: NEGATIVE
Nitrite: NEGATIVE
Protein, ur: 30 mg/dL — AB
Specific Gravity, Urine: 1.024 (ref 1.005–1.030)
pH: 5.5 (ref 5.0–8.0)

## 2023-02-24 LAB — CBC
HCT: 35.3 % — ABNORMAL LOW (ref 36.0–46.0)
Hemoglobin: 10.6 g/dL — ABNORMAL LOW (ref 12.0–15.0)
MCH: 24.1 pg — ABNORMAL LOW (ref 26.0–34.0)
MCHC: 30 g/dL (ref 30.0–36.0)
MCV: 80.2 fL (ref 80.0–100.0)
Platelets: 470 10*3/uL — ABNORMAL HIGH (ref 150–400)
RBC: 4.4 MIL/uL (ref 3.87–5.11)
RDW: 15.9 % — ABNORMAL HIGH (ref 11.5–15.5)
WBC: 8.8 10*3/uL (ref 4.0–10.5)
nRBC: 0 % (ref 0.0–0.2)

## 2023-02-24 LAB — COMPREHENSIVE METABOLIC PANEL
ALT: 10 U/L (ref 0–44)
AST: 18 U/L (ref 15–41)
Albumin: 4.6 g/dL (ref 3.5–5.0)
Alkaline Phosphatase: 49 U/L (ref 38–126)
Anion gap: 11 (ref 5–15)
BUN: 12 mg/dL (ref 6–20)
CO2: 22 mmol/L (ref 22–32)
Calcium: 9.4 mg/dL (ref 8.9–10.3)
Chloride: 106 mmol/L (ref 98–111)
Creatinine, Ser: 0.72 mg/dL (ref 0.44–1.00)
GFR, Estimated: >60 mL/min (ref >60)
Glucose, Bld: 96 mg/dL (ref 70–99)
Potassium: 3.6 mmol/L (ref 3.5–5.1)
Sodium: 139 mmol/L (ref 135–145)
Total Bilirubin: 0.4 mg/dL (ref 0.3–1.2)
Total Protein: 7.6 g/dL (ref 6.5–8.1)

## 2023-02-24 LAB — LIPASE, BLOOD: Lipase: 32 U/L (ref 11–51)

## 2023-02-24 LAB — PREGNANCY, URINE: Preg Test, Ur: NEGATIVE

## 2023-02-24 MED ORDER — ONDANSETRON HCL 4 MG/2ML IJ SOLN
4.0000 mg | Freq: Once | INTRAMUSCULAR | Status: AC | PRN
  Administered 2023-02-24: 4 mg via INTRAVENOUS
  Filled 2023-02-24: qty 2

## 2023-02-24 MED ORDER — IOHEXOL 300 MG/ML  SOLN
100.0000 mL | Freq: Once | INTRAMUSCULAR | Status: AC | PRN
  Administered 2023-02-24: 80 mL via INTRAVENOUS

## 2023-02-24 MED ORDER — ONDANSETRON HCL 4 MG PO TABS: 4.0000 mg | ORAL_TABLET | Freq: Four times a day (QID) | ORAL | 0 refills | Status: DC

## 2023-02-24 NOTE — ED Triage Notes (Signed)
Pt POV, caox4, ambulatory stating "I think I have food poisoning" c/o N/V and abd cramping that started around 1645 today after eating at a restaurant. Pt denies diarrhea, fever, does c/o some dizziness.

## 2023-02-24 NOTE — ED Provider Notes (Signed)
[place] EMERGENCY DEPARTMENT AT [facility] Provider Note   CSN: [id number] Arrival date & time: 02/24/23  1712     History  Chief Complaint  Patient presents with   Emesis   HPI Debbie Stewart is a 27 y.o. female with anemia and history of ovarian cysts presenting for nausea vomiting and abdominal cramping.  Started around 1645 today approximately 40 minutes after eating a cheeseburger, fries and onion rings.  Pain is located all across the mid abdomen it feels sharp and feels more like cramping, it is intermittent and radiates to her lower back.  States she has vomited twice since symptoms began.  Emesis is nonbloody.  States she also has issues with constipation and has not had a good bowel movement in a month.  She usually has watery diarrhea.  Did have a bowel movement this morning.  Denies fever but states she was "sweaty" this afternoon when symptoms began.  Denies urinary changes.  Denies abnormal vaginal discharge or bleeding.   Emesis      Home Medications Prior to Admission medications   Medication Sig Start Date End Date Taking? Authorizing Provider  ondansetron (ZOFRAN) 4 MG tablet Take 1 tablet (4 mg total) by mouth every 6 (six) hours. 02/24/23  Yes Gareth Eagle, PA-C  famotidine (PEPCID) 10 MG tablet Take 1 tablet (10 mg total) by mouth 2 (two) times daily. 06/20/22   Carlynn Herald, CNM  hydrOXYzine (ATARAX) 25 MG tablet Take 1 tablet (25 mg total) by mouth every 8 (eight) hours as needed. 03/19/22   Marita Kansas, PA-C  ketorolac (TORADOL) 10 MG tablet Take 1 tablet (10 mg total) by mouth every 6 (six) hours as needed. 07/05/22   Donette Larry, CNM  omeprazole (PRILOSEC) 20 MG capsule Take 1 capsule (20 mg total) by mouth daily for 20 days. 07/27/21 08/16/21  Cheryll Cockayne, MD  scopolamine (TRANSDERM-SCOP) 1 MG/3DAYS Place 1 patch (1.5 mg total) onto the skin every 3 (three) days. 06/20/22   Carlynn Herald, CNM      Allergies    Shellfish  allergy    Review of Systems   Review of Systems  Gastrointestinal:  Positive for vomiting.    Physical Exam   Vitals:   02/24/23 1746 02/24/23 1922  BP:  120/77  Pulse:  85  Resp:  18  Temp:    SpO2: 100% 100%    CONSTITUTIONAL:  well-appearing, NAD NEURO:  Alert and oriented x 3, CN 3-12 grossly intact EYES:  eyes equal and reactive ENT/NECK:  Supple, no stridor  CARDIO:  regular rate and rhythm, appears well-perfused  PULM:  No respiratory distress, CTAB GI/GU:  non-distended, soft, right lower quadrant tenderness with guarding MSK/SPINE:  No gross deformities, no edema, moves all extremities  SKIN:  no rash, atraumatic  *Additional and/or pertinent findings included in MDM below  ED Results / Procedures / Treatments   Labs (all labs ordered are listed, but only abnormal results are displayed) Labs Reviewed  CBC - Abnormal; Notable for the following components:      Result Value   Hemoglobin 10.6 (*)    HCT 35.3 (*)    MCH 24.1 (*)    RDW 15.9 (*)    Platelets 470 (*)    All other components within normal limits  URINALYSIS, ROUTINE W REFLEX MICROSCOPIC - Abnormal; Notable for the following components:   Ketones, ur TRACE (*)    Protein, ur 30 (*)    All other components  within normal limits  LIPASE, BLOOD  COMPREHENSIVE METABOLIC PANEL  PREGNANCY, URINE    EKG None  Radiology CT ABDOMEN PELVIS W CONTRAST  Result Date: 02/24/2023 CLINICAL DATA:  Right lower quadrant abdominal pain. EXAM: CT ABDOMEN AND PELVIS WITH CONTRAST TECHNIQUE: Multidetector CT imaging of the abdomen and pelvis was performed using the standard protocol following bolus administration of intravenous contrast. RADIATION DOSE REDUCTION: This exam was performed according to the departmental dose-optimization program which includes automated exposure control, adjustment of the mA and/or kV according to patient size and/or use of iterative reconstruction technique. CONTRAST:  80mL OMNIPAQUE  IOHEXOL 300 MG/ML  SOLN COMPARISON:  Mar 19, 2022. FINDINGS: Lower chest: No acute abnormality. Hepatobiliary: No focal liver abnormality is seen. No gallstones, gallbladder wall thickening, or biliary dilatation. Pancreas: Unremarkable. No pancreatic ductal dilatation or surrounding inflammatory changes. Spleen: Normal in size without focal abnormality. Adrenals/Urinary Tract: Adrenal glands are unremarkable. Kidneys are normal, without renal calculi, focal lesion, or hydronephrosis. Bladder is unremarkable. Stomach/Bowel: Stomach is within normal limits. No signs of appendicitis. No evidence of bowel wall thickening, distention, or inflammatory changes. Vascular/Lymphatic: No significant vascular findings are present. No enlarged abdominal or pelvic lymph nodes. Reproductive: 3.9 x 3.9 x 6.3 cm right ovarian cyst. Normal appearance of the uterus and left ovary. Other: No abdominal wall hernia or abnormality. No abdominopelvic ascites. Musculoskeletal: No acute or significant osseous findings. IMPRESSION: 1. No evidence of acute abnormalities within the abdomen. 2. 3.9 x 3.9 x 6.3 cm right ovarian cyst. Given that the patient is symptomatic further evaluation with pelvic ultrasound or MRI may be considered. Electronically Signed   By: Ted Mcalpine M.D.   On: 02/24/2023 19:26    Procedures Procedures    Medications Ordered in ED Medications  ondansetron (ZOFRAN) injection 4 mg (4 mg Intravenous Given 02/24/23 1747)  iohexol (OMNIPAQUE) 300 MG/ML solution 100 mL (80 mLs Intravenous Contrast Given 02/24/23 1900)    ED Course/ Medical Decision Making/ A&P                             Medical Decision Making Amount and/or Complexity of Data Reviewed Labs: ordered. Radiology: ordered.  Risk Prescription drug management.   Initial Impression and Ddx 27 year old well-appearing female presenting for nausea vomiting and abdominal cramping.  Exam was notable for right lower quadrant tenderness  bilaterally otherwise she looks clinically well.  DDx includes appendicitis, ovarian torsion, ectopic pregnancy, nephrolithiasis, diverticulitis. Patient PMH that increases complexity of ED encounter: History of ovarian cyst  Interpretation of Diagnostics I independent reviewed and interpreted the labs as followed: anemia,   - I independently visualized the following imaging with scope of interpretation limited to determining acute life threatening conditions related to emergency care: CT ab/pelvis, which revealed right ovarian cyst  Patient Reassessment and Ultimate Disposition/Management Patient appears clinically well.  Did not initially endorse right lower quadrant tenderness but I did elicited on exam.  CT scan did show ovarian cyst.  Considered ovarian torsion but unlikely given how clinically well she appears and the fact that the tenderness was incidental on exam.  Patient states she is also had multiple ovarian cysts.  States that the pain in the past has been much worse.  Patient does have close follow-up with OB/GYN.  Advised her to follow-up with her OB/GYN for this new finding.  Discussed pertinent return precautions.  Treated her nausea with Zofran and on reassessment she states she felt better.  Advised to go home and work on assertive rehydration.  Also advised to follow-up with her PCP.  I do think her symptoms today are consistent with a foodborne illness which I explained to her that we will likely get better in the next 48 hours.  Patient has not vomited since initial encounter.  Patient management required discussion with the following services or consulting groups:  None  Complexity of Problems Addressed Acute complicated illness or Injury  Additional Data Reviewed and Analyzed Further history obtained from: Past medical history and medications listed in the EMR and Prior ED visit notes  Patient Encounter Risk Assessment Prescriptions         Final Clinical  Impression(s) / ED Diagnoses Final diagnoses:  Nausea and vomiting, unspecified vomiting type  Abdominal cramping    Rx / DC Orders ED Discharge Orders          Ordered    ondansetron (ZOFRAN) 4 MG tablet  Every 6 hours        02/24/23 2023              Gareth Eagle, PA-C 02/24/23 2025    Gwyneth Sprout, MD 02/24/23 2307

## 2023-02-24 NOTE — Discharge Instructions (Signed)
Evaluation for your abdominal cramping and nausea and vomiting were overall reassuring.  I do suspect it is likely that you sustained a foodborne illness earlier today of some kind.  Suspect your symptoms will improve in the next 24 to 48 hours.  Recommend assertive hydration at home with water and Gatorade.  Recommend 2 L tonight and 2 L tomorrow.  Also recommend that you follow-up with your OB/GYN provider for the discovered right ovarian cyst.  If you start develop worsening abdominal pain, abnormal vaginal bleeding or discharge, shortness of breath or extreme fatigue or any other concerning symptom please return emergency department further evaluation.

## 2023-06-12 ENCOUNTER — Encounter (HOSPITAL_COMMUNITY): Payer: Self-pay | Admitting: *Deleted

## 2023-06-12 ENCOUNTER — Inpatient Hospital Stay (HOSPITAL_COMMUNITY)
Admission: AD | Admit: 2023-06-12 | Discharge: 2023-06-12 | Disposition: A | Discharge status: Home / Self Care | Payer: BC Managed Care – PPO | Attending: Family Medicine | Admitting: Family Medicine

## 2023-06-12 DIAGNOSIS — O209 Hemorrhage in early pregnancy, unspecified: Secondary | ICD-10-CM | POA: Diagnosis present

## 2023-06-12 DIAGNOSIS — Z789 Other specified health status: Secondary | ICD-10-CM

## 2023-06-12 DIAGNOSIS — R1084 Generalized abdominal pain: Secondary | ICD-10-CM

## 2023-06-12 DIAGNOSIS — R109 Unspecified abdominal pain: Secondary | ICD-10-CM | POA: Diagnosis present

## 2023-06-12 DIAGNOSIS — R040 Epistaxis: Secondary | ICD-10-CM | POA: Diagnosis present

## 2023-06-12 DIAGNOSIS — N920 Excessive and frequent menstruation with regular cycle: Secondary | ICD-10-CM

## 2023-06-12 HISTORY — DX: Unspecified convulsions: R56.9

## 2023-06-12 HISTORY — DX: Thyrotoxicosis, unspecified without thyrotoxic crisis or storm: E05.90

## 2023-06-12 HISTORY — DX: Benign neoplasm of connective and other soft tissue, unspecified: D21.9

## 2023-06-12 LAB — URINALYSIS, ROUTINE W REFLEX MICROSCOPIC
Bacteria, UA: NONE SEEN
Bilirubin Urine: NEGATIVE
Glucose, UA: NEGATIVE mg/dL
Ketones, ur: NEGATIVE mg/dL
Leukocytes,Ua: NEGATIVE
Nitrite: NEGATIVE
Protein, ur: NEGATIVE mg/dL
RBC / HPF: >50 RBC/hpf (ref 0–5)
Specific Gravity, Urine: 1.011 (ref 1.005–1.030)
pH: 5 (ref 5.0–8.0)

## 2023-06-12 LAB — CBC
HCT: 33.7 % — ABNORMAL LOW (ref 36.0–46.0)
Hemoglobin: 10.2 g/dL — ABNORMAL LOW (ref 12.0–15.0)
MCH: 25 pg — ABNORMAL LOW (ref 26.0–34.0)
MCHC: 30.3 g/dL (ref 30.0–36.0)
MCV: 82.6 fL (ref 80.0–100.0)
Platelets: 417 10*3/uL — ABNORMAL HIGH (ref 150–400)
RBC: 4.08 MIL/uL (ref 3.87–5.11)
RDW: 18 % — ABNORMAL HIGH (ref 11.5–15.5)
WBC: 6 10*3/uL (ref 4.0–10.5)
nRBC: 0 % (ref 0.0–0.2)

## 2023-06-12 LAB — HCG, QUANTITATIVE, PREGNANCY: hCG, Beta Chain, Quant, S: 1 m[IU]/mL (ref <5)

## 2023-06-12 LAB — POCT PREGNANCY, URINE: Preg Test, Ur: NEGATIVE

## 2023-06-12 LAB — PREGNANCY, URINE: Preg Test, Ur: NEGATIVE

## 2023-06-12 MED ORDER — ACETAMINOPHEN 500 MG PO TABS
1000.0000 mg | ORAL_TABLET | Freq: Once | ORAL | Status: AC
  Administered 2023-06-12: 1000 mg via ORAL
  Filled 2023-06-12: qty 2

## 2023-06-12 MED ORDER — IBUPROFEN 800 MG PO TABS
800.0000 mg | ORAL_TABLET | Freq: Once | ORAL | Status: AC
  Administered 2023-06-12: 800 mg via ORAL
  Filled 2023-06-12: qty 1

## 2023-06-12 NOTE — MAU Note (Addendum)
Debbie Stewart is a 27 y.o. at Unknown here in MAU reporting: +HPT, faint. Started cramping last night. Started bleeding this morning. Cleaned up and went to work, cramping and bleeding got worse, so she came here. Lab had brought pt back to get blood work.  Started bleeding more, I was called to family rm.  Got pt and assisted her to rm 128.  Pt cleaned up, pad in place and in to bed.  Has a nose bleed now, says she gets them whenever she is stressed. Has had some nausea LMP: ~7/16 Onset of complaint: last night Pain score: 5 There were no vitals filed for this visit.    Lab orders placed from triage:   Scant bleeding now, no active bleeding. Pt very anxious.  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 "So stressed"

## 2023-06-12 NOTE — MAU Provider Note (Signed)
History     CSN: [id number]  Arrival date and time: 06/12/23 4098   Event Date/Time   First Provider Initiated Contact with Patient 06/12/23 1142      Chief Complaint  Patient presents with   Possible Pregnancy   Vaginal Bleeding   Abdominal Pain   Possible Pregnancy Associated symptoms include abdominal pain. Pertinent negatives include no chest pain, chills, congestion, coughing, fever, nausea, rash, sore throat or vomiting.  Vaginal Bleeding Associated symptoms include abdominal pain. Pertinent negatives include no back pain, chills, diarrhea, dysuria, fever, flank pain, nausea, rash, sore throat or vomiting.  Abdominal Pain Pertinent negatives include no diarrhea, dysuria, fever, nausea or vomiting.   Patient is 27 y.o. G2P0010 Unknown here with complaints of vaginal bleeding in the setting of faintly positive home pregnancy test last week.  Urine pregnancy test here in the MAU was negative.  Patient reports that today she started having more bleeding with passage of clots.  She reports mild abdominal cramping.  Patient's last menstrual period was 05/15/2023. .    OB History     Gravida  2   Para      Term      Preterm      AB  1   Living         SAB  1   IAB      Ectopic      Multiple      Live Births              Past Medical History:  Diagnosis Date   Anemia    Anxiety    Asthma    Depression    Ovarian cyst     Past Surgical History:  Procedure Laterality Date   DILATION AND CURETTAGE OF UTERUS     WISDOM TOOTH EXTRACTION      Family History  Problem Relation Age of Onset   Sickle cell trait Mother     Social History   Tobacco Use   Smoking status: Never   Smokeless tobacco: Never  Vaping Use   Vaping status: Never Used  Substance Use Topics   Alcohol use: Not Currently   Drug use: Not Currently    Types: Marijuana    Comment: tired once before pregnancy    Allergies:  Allergies  Allergen Reactions   Shellfish  Allergy Anaphylaxis    Medications Prior to Admission  Medication Sig Dispense Refill Last Dose   hydrOXYzine (ATARAX) 25 MG tablet Take 1 tablet (25 mg total) by mouth every 8 (eight) hours as needed. (Patient taking differently: Take 25 mg by mouth 2 (two) times daily. Makes her too tired to take every 8 hrs) 30 tablet 0 Past Week   famotidine (PEPCID) 10 MG tablet Take 1 tablet (10 mg total) by mouth 2 (two) times daily. 30 tablet 1    ketorolac (TORADOL) 10 MG tablet Take 1 tablet (10 mg total) by mouth every 6 (six) hours as needed. 20 tablet 0    omeprazole (PRILOSEC) 20 MG capsule Take 1 capsule (20 mg total) by mouth daily for 20 days. 20 capsule 0    ondansetron (ZOFRAN) 4 MG tablet Take 1 tablet (4 mg total) by mouth every 6 (six) hours. 12 tablet 0    scopolamine (TRANSDERM-SCOP) 1 MG/3DAYS Place 1 patch (1.5 mg total) onto the skin every 3 (three) days. 10 patch 12     Review of Systems  Constitutional:  Negative for chills and fever.  HENT:  Negative for congestion and sore throat.   Eyes:  Negative for pain and visual disturbance.  Respiratory:  Negative for cough, chest tightness and shortness of breath.   Cardiovascular:  Negative for chest pain.  Gastrointestinal:  Positive for abdominal pain. Negative for diarrhea, nausea and vomiting.  Endocrine: Negative for cold intolerance and heat intolerance.  Genitourinary:  Positive for vaginal bleeding. Negative for dysuria and flank pain.  Musculoskeletal:  Negative for back pain.  Skin:  Negative for rash.  Allergic/Immunologic: Negative for food allergies.  Neurological:  Negative for dizziness and light-headedness.  Psychiatric/Behavioral:  Negative for agitation.    Physical Exam   Blood pressure (!) 122/92, pulse 64, temperature 98.5 F (36.9 C), temperature source Oral, resp. rate (!) 22, last menstrual period 05/15/2023, SpO2 100%, unknown if currently breastfeeding.  Physical Exam Vitals and nursing note reviewed.   Constitutional:      General: She is not in acute distress.    Appearance: She is well-developed.  HENT:     Head: Normocephalic and atraumatic.  Eyes:     General: No scleral icterus.    Conjunctiva/sclera: Conjunctivae normal.  Cardiovascular:     Rate and Rhythm: Normal rate.  Pulmonary:     Effort: Pulmonary effort is normal.  Chest:     Chest wall: No tenderness.  Abdominal:     Palpations: Abdomen is soft.     Tenderness: There is no abdominal tenderness. There is no guarding or rebound.  Genitourinary:    Vagina: Normal.  Musculoskeletal:        General: Normal range of motion.     Cervical back: Normal range of motion and neck supple.  Skin:    General: Skin is warm and dry.     Findings: No rash.  Neurological:     Mental Status: She is alert and oriented to person, place, and time.     MAU Course  Procedures   MDM: moderate  This patient presents to the ED for concern of   Chief Complaint  Patient presents with   Possible Pregnancy   Vaginal Bleeding   Abdominal Pain     These complains involves an extensive number of treatment options, and is a complaint that carries with it a high risk of complications and morbidity.  The differential diagnosis for  1.vaginal bleeding in early pregnancy INCLUDES threatened miscarriage, ectopic pregnancy (unless IUP confirmed), normal variant bleeding with live IUP-mostly likely subchorionic hemorrhage in this case. Most likely for this patient is early pregnancy loss with faintly positive home pregnancy test last week with now negative urine pregnancy test and serum beta-hCG that were negative   External records from outside source obtained and reviewed including [facility]  Lab Tests: CBC, BHCG, and Gonorrhea/Chlamydia   I ordered, and personally interpreted labs.  The pertinent results include:   Results for orders placed or performed during the hospital encounter of 06/12/23 (from the past 24 hour(s))   Pregnancy, urine POC     Status: None   Collection Time: 06/12/23 10:33 AM  Result Value Ref Range   Preg Test, Ur NEGATIVE NEGATIVE  Pregnancy, urine     Status: None   Collection Time: 06/12/23 10:34 AM  Result Value Ref Range   Preg Test, Ur NEGATIVE NEGATIVE  Urinalysis, Routine w reflex microscopic -Urine, Clean Catch     Status: Abnormal   Collection Time: 06/12/23 10:34 AM  Result Value Ref Range   Color, Urine YELLOW YELLOW   APPearance HAZY (A)  CLEAR   Specific Gravity, Urine 1.011 1.005 - 1.030   pH 5.0 5.0 - 8.0   Glucose, UA NEGATIVE NEGATIVE mg/dL   Hgb urine dipstick LARGE (A) NEGATIVE   Bilirubin Urine NEGATIVE NEGATIVE   Ketones, ur NEGATIVE NEGATIVE mg/dL   Protein, ur NEGATIVE NEGATIVE mg/dL   Nitrite NEGATIVE NEGATIVE   Leukocytes,Ua NEGATIVE NEGATIVE   RBC / HPF >50 0 - 5 RBC/hpf   WBC, UA 0-5 0 - 5 WBC/hpf   Bacteria, UA NONE SEEN NONE SEEN   Squamous Epithelial / HPF 6-10 0 - 5 /HPF   Mucus PRESENT   CBC     Status: Abnormal   Collection Time: 06/12/23 10:52 AM  Result Value Ref Range   WBC 6.0 4.0 - 10.5 K/uL   RBC 4.08 3.87 - 5.11 MIL/uL   Hemoglobin 10.2 (L) 12.0 - 15.0 g/dL   HCT 13.2 (L) 44.0 - 10.2 %   MCV 82.6 80.0 - 100.0 fL   MCH 25.0 (L) 26.0 - 34.0 pg   MCHC 30.3 30.0 - 36.0 g/dL   RDW 72.5 (H) 36.6 - 44.0 %   Platelets 417 (H) 150 - 400 K/uL   nRBC 0.0 0.0 - 0.2 %  hCG, quantitative, pregnancy     Status: None   Collection Time: 06/12/23 10:53 AM  Result Value Ref Range   hCG, Beta Chain, Quant, S 1 <5 mIU/mL  \   Medicines ordered and prescription drug management:  Medications:  Tylenol and Ibuprofen   Reevaluation of the patient after these medicines showed that the patient improved I have reviewed the patients home medicines and have made adjustments as needed   MAU Course:  12:28 PM Up date patient patient results from serum bhcg   After the interventions noted above, I reevaluated the patient and found that they have  :improved  Dispostion: discharged   Assessment and Plan   1. Not currently pregnant   2. Generalized abdominal pain   3. Menorrhagia with regular cycle    - Likely normal menses - Discussed that if a miscarriage was happening currently I would expect there to still be pregnancy hormone in her blood - Patient reported dizziness but CBC does not show anemia, recommended PCP follow up - Return to main ER if pain persists - Discharged home  Federico Flake 06/12/2023, 12:22 PM

## 2023-07-25 ENCOUNTER — Encounter (HOSPITAL_COMMUNITY): Payer: Self-pay | Admitting: Emergency Medicine

## 2023-07-25 ENCOUNTER — Emergency Department (HOSPITAL_COMMUNITY)
Admission: EM | Admit: 2023-07-25 | Discharge: 2023-07-25 | Disposition: A | Discharge status: Home / Self Care | Payer: BC Managed Care – PPO | Attending: Emergency Medicine | Admitting: Emergency Medicine

## 2023-07-25 DIAGNOSIS — T7840XA Allergy, unspecified, initial encounter: Secondary | ICD-10-CM | POA: Insufficient documentation

## 2023-07-25 DIAGNOSIS — J45909 Unspecified asthma, uncomplicated: Secondary | ICD-10-CM | POA: Insufficient documentation

## 2023-07-25 DIAGNOSIS — T782XXA Anaphylactic shock, unspecified, initial encounter: Secondary | ICD-10-CM | POA: Insufficient documentation

## 2023-07-25 MED ORDER — EPINEPHRINE 0.3 MG/0.3ML IJ SOAJ: 0.3000 mg | INTRAMUSCULAR | 0 refills | Status: AC | PRN

## 2023-07-25 MED ORDER — SODIUM CHLORIDE 0.9 % IV BOLUS
1000.0000 mL | Freq: Once | INTRAVENOUS | Status: AC
  Administered 2023-07-25: 1000 mL via INTRAVENOUS

## 2023-07-25 MED ORDER — PREDNISONE 20 MG PO TABS: 40.0000 mg | ORAL_TABLET | Freq: Every day | ORAL | 0 refills | Status: DC

## 2023-07-25 MED ORDER — EPINEPHRINE 0.3 MG/0.3ML IJ SOAJ
0.3000 mg | Freq: Once | INTRAMUSCULAR | Status: AC
  Administered 2023-07-25: 0.3 mg via INTRAMUSCULAR
  Filled 2023-07-25: qty 0.3

## 2023-07-25 MED ORDER — FAMOTIDINE 20 MG PO TABS: 20.0000 mg | ORAL_TABLET | Freq: Two times a day (BID) | ORAL | 0 refills | Status: DC

## 2023-07-25 MED ORDER — ALBUTEROL SULFATE HFA 108 (90 BASE) MCG/ACT IN AERS
1.0000 | INHALATION_SPRAY | Freq: Once | RESPIRATORY_TRACT | Status: AC
  Administered 2023-07-25: 2 via RESPIRATORY_TRACT
  Filled 2023-07-25: qty 6.7

## 2023-07-25 MED ORDER — METHYLPREDNISOLONE SODIUM SUCC 125 MG IJ SOLR
125.0000 mg | Freq: Once | INTRAMUSCULAR | Status: AC
  Administered 2023-07-25: 125 mg via INTRAVENOUS
  Filled 2023-07-25: qty 2

## 2023-07-25 MED ORDER — ALBUTEROL SULFATE (2.5 MG/3ML) 0.083% IN NEBU
2.5000 mg | INHALATION_SOLUTION | Freq: Once | RESPIRATORY_TRACT | Status: AC
  Administered 2023-07-25: 2.5 mg via RESPIRATORY_TRACT
  Filled 2023-07-25: qty 3

## 2023-07-25 MED ORDER — DIPHENHYDRAMINE HCL 50 MG/ML IJ SOLN
25.0000 mg | Freq: Once | INTRAMUSCULAR | Status: AC
  Administered 2023-07-25: 25 mg via INTRAVENOUS
  Filled 2023-07-25: qty 1

## 2023-07-25 MED ORDER — FAMOTIDINE IN NACL 20-0.9 MG/50ML-% IV SOLN
20.0000 mg | Freq: Once | INTRAVENOUS | Status: AC
  Administered 2023-07-25: 20 mg via INTRAVENOUS
  Filled 2023-07-25: qty 50

## 2023-07-25 NOTE — ED Notes (Signed)
Pt did say that she is using a new eye cream , under her eyes are red and puffy

## 2023-07-25 NOTE — ED Provider Notes (Signed)
[place] EMERGENCY DEPARTMENT AT [facility] Provider Note   CSN: [id number] Arrival date & time: 07/25/23  1722     History  Chief Complaint  Patient presents with   Allergic Reaction    Debbie Stewart is a 27 y.o. female past medical history significant for asthma, anxiety, depression, allergies who presents with concern for allergic reaction to unknown substance.  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  She reports she used her inhaler today with minimal relief.  She reports that she first noticed some swelling underneath her eyes yesterday, began having difficulty breathing today.  She reports that she felt that her throat was closing.  She reports still having some throat tightness, as well as some lower GI pain.  No previous history of anaphylaxis, she took some Allegra and Tylenol earlier without significant relief of symptoms.   Allergic Reaction      Home Medications Prior to Admission medications   Medication Sig Start Date End Date Taking? Authorizing Provider  famotidine (PEPCID) 10 MG tablet Take 1 tablet (10 mg total) by mouth 2 (two) times daily. 06/20/22   Carlynn Herald, CNM  hydrOXYzine (ATARAX) 25 MG tablet Take 1 tablet (25 mg total) by mouth every 8 (eight) hours as needed. Patient taking differently: Take 25 mg by mouth 2 (two) times daily. Makes her too tired to take every 8 hrs 03/19/22   Marita Kansas, PA-C  omeprazole (PRILOSEC) 20 MG capsule Take 1 capsule (20 mg total) by mouth daily for 20 days. 07/27/21 08/16/21  Cheryll Cockayne, MD  ondansetron (ZOFRAN) 4 MG tablet Take 1 tablet (4 mg total) by mouth every 6 (six) hours. 02/24/23   Gareth Eagle, PA-C  scopolamine (TRANSDERM-SCOP) 1 MG/3DAYS Place 1 patch (1.5 mg total) onto the skin every 3 (three) days. 06/20/22   Carlynn Herald, CNM      Allergies    Shellfish allergy    Review of Systems   Review of Systems  All other systems reviewed and are negative.   Physical  Exam Updated Vital Signs BP 131/84 (BP Location: Right Arm)   Pulse 99   Temp 98.2 F (36.8 C) (Oral)   Resp 19   SpO2 100%  Physical Exam Vitals and nursing note reviewed.  Constitutional:      General: She is not in acute distress.    Appearance: Normal appearance.  HENT:     Head: Normocephalic and atraumatic.     Mouth/Throat:     Comments: No significant posterior oropharynx erythema, swelling, exudate. Uvula midline, tonsils 1+ bilaterally.  No trismus, stridor, evidence of PTA, floor of mouth swelling or redness.   Eyes:     General:        Right eye: No discharge.        Left eye: No discharge.  Cardiovascular:     Rate and Rhythm: Normal rate and regular rhythm.     Heart sounds: No murmur heard.    No friction rub. No gallop.  Pulmonary:     Effort: Pulmonary effort is normal.     Breath sounds: Normal breath sounds.     Comments: Mild wheezing bilaterally, no rhonchi, stridor, rales Abdominal:     General: Bowel sounds are normal.     Palpations: Abdomen is soft.  Skin:    General: Skin is warm and dry.     Capillary Refill: Capillary refill takes less than 2 seconds.     Comments: Noted under  eye swelling, redness.  No other hives noted.  Neurological:     Mental Status: She is alert and oriented to person, place, and time.  Psychiatric:        Mood and Affect: Mood normal.        Behavior: Behavior normal.     ED Results / Procedures / Treatments   Labs (all labs ordered are listed, but only abnormal results are displayed) Labs Reviewed - No data to display  EKG EKG Interpretation Date/Time:  Wednesday July 25 2023 17:36:44 EDT Ventricular Rate:  86 PR Interval:  156 QRS Duration:  90 QT Interval:  364 QTC Calculation: 435 R Axis:   87  Text Interpretation: Normal sinus rhythm T wave abnormality, consider anterior ischemia Abnormal ECG When compared with ECG of 20-Aug-2022 17:35, No significant change since last tracing Confirmed by Richardean Canal [phone]) on 07/25/2023 9:15:56 PM  Radiology No results found.  Procedures Procedures    Medications Ordered in ED Medications  albuterol (PROVENTIL) (2.5 MG/3ML) 0.083% nebulizer solution 2.5 mg (2.5 mg Nebulization Given 07/25/23 1750)  EPINEPHrine (EPI-PEN) injection 0.3 mg (0.3 mg Intramuscular Given 07/25/23 2019)  methylPREDNISolone sodium succinate (SOLU-MEDROL) 125 mg/2 mL injection 125 mg (125 mg Intravenous Given 07/25/23 2020)  famotidine (PEPCID) IVPB 20 mg premix (0 mg Intravenous Stopped 07/25/23 2049)  albuterol (VENTOLIN HFA) 108 (90 Base) MCG/ACT inhaler 1-2 puff (2 puffs Inhalation Given 07/25/23 2018)  sodium chloride 0.9 % bolus 1,000 mL (1,000 mLs Intravenous New Bag/Given 07/25/23 2020)  diphenhydrAMINE (BENADRYL) injection 25 mg (25 mg Intravenous Given 07/25/23 2021)    ED Course/ Medical Decision Making/ A&P                                 Medical Decision Making Amount and/or Complexity of Data Reviewed Labs: ordered.  Risk Prescription drug management.   This patient is a 27 y.o. female who presents to the ED for concern of allergic reaction, wheezing, throat swelling, under eye hives.   Differential diagnoses prior to evaluation: Allergic reaction, asthma exacerbation, anaphylaxis, other  Past Medical History / Social History / Additional history: Chart reviewed. Pertinent results include: allergies, asthma, eczema  Physical Exam: Physical exam performed. The pertinent findings include: No significant posterior oropharynx erythema, swelling, exudate. Uvula midline, tonsils 1+ bilaterally.  No trismus, stridor, evidence of PTA, floor of mouth swelling or redness.   Mild wheezing bilaterally, no rhonchi, stridor, rales  Noted under eye swelling, redness.  No other hives noted.   Medications / Treatment: Pepcid, Benadryl, Solu-Medrol, epi, albuterol for wheezing, concern for anaphylaxis -- will discharge with Pepcid, steroids with    Disposition: After consideration of the diagnostic results and the patients response to treatment, I feel that patient concern for early anaphylaxis, will need reevaluation at 3 to 4-hour to ensure symptoms continue to remain resolved with no biphasic allergic reaction  9:33 PM Care of Debbie Stewart transferred to Dr. Silverio Lay at the end of my shift as the patient will require reassessment once labs/imaging have resulted. Patient presentation, ED course, and plan of care discussed with review of all pertinent labs and imaging. Please see his/her note for further details regarding further ED course and disposition. Plan at time of handoff is reassess in 3 to 4 hours, discharged with Pepcid, prednisone. This may be altered or completely changed at the discretion of the oncoming team pending results of further workup.  Final  Clinical Impression(s) / ED Diagnoses Final diagnoses:  Anaphylaxis, initial encounter  Allergic reaction, initial encounter    Rx / DC Orders ED Discharge Orders     None         [place] 07/25/23 2133    Charlynne Pander, MD 07/25/23 2224

## 2023-07-25 NOTE — Discharge Instructions (Signed)
Take prednisone as prescribed   Carry epi pen with you   See your allergist   Return to ER if you have trouble breathing, throat closing

## 2023-07-25 NOTE — ED Triage Notes (Signed)
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

## 2023-07-31 ENCOUNTER — Observation Stay (HOSPITAL_COMMUNITY)
Admission: EM | Admit: 2023-07-31 | Discharge: 2023-08-03 | Disposition: A | Discharge status: Home / Self Care | Payer: BC Managed Care – PPO | Attending: Internal Medicine | Admitting: Internal Medicine

## 2023-07-31 ENCOUNTER — Emergency Department (HOSPITAL_COMMUNITY): Payer: BC Managed Care – PPO

## 2023-07-31 ENCOUNTER — Encounter (HOSPITAL_COMMUNITY): Payer: Self-pay

## 2023-07-31 ENCOUNTER — Other Ambulatory Visit: Payer: Self-pay

## 2023-07-31 DIAGNOSIS — D72829 Elevated white blood cell count, unspecified: Secondary | ICD-10-CM | POA: Diagnosis not present

## 2023-07-31 DIAGNOSIS — K219 Gastro-esophageal reflux disease without esophagitis: Secondary | ICD-10-CM | POA: Diagnosis not present

## 2023-07-31 DIAGNOSIS — J4521 Mild intermittent asthma with (acute) exacerbation: Principal | ICD-10-CM | POA: Insufficient documentation

## 2023-07-31 DIAGNOSIS — R0600 Dyspnea, unspecified: Secondary | ICD-10-CM

## 2023-07-31 DIAGNOSIS — R0602 Shortness of breath: Secondary | ICD-10-CM | POA: Diagnosis present

## 2023-07-31 DIAGNOSIS — R061 Stridor: Secondary | ICD-10-CM | POA: Diagnosis present

## 2023-07-31 DIAGNOSIS — J45901 Unspecified asthma with (acute) exacerbation: Secondary | ICD-10-CM | POA: Diagnosis present

## 2023-07-31 DIAGNOSIS — J383 Other diseases of vocal cords: Secondary | ICD-10-CM | POA: Diagnosis not present

## 2023-07-31 DIAGNOSIS — Z79899 Other long term (current) drug therapy: Secondary | ICD-10-CM | POA: Diagnosis not present

## 2023-07-31 DIAGNOSIS — R0789 Other chest pain: Secondary | ICD-10-CM

## 2023-07-31 DIAGNOSIS — Z7951 Long term (current) use of inhaled steroids: Secondary | ICD-10-CM | POA: Diagnosis not present

## 2023-07-31 LAB — BASIC METABOLIC PANEL
Anion gap: 12 (ref 5–15)
BUN: 10 mg/dL (ref 6–20)
CO2: 24 mmol/L (ref 22–32)
Calcium: 9.1 mg/dL (ref 8.9–10.3)
Chloride: 104 mmol/L (ref 98–111)
Creatinine, Ser: 0.68 mg/dL (ref 0.44–1.00)
GFR, Estimated: >60 mL/min (ref >60)
Glucose, Bld: 95 mg/dL (ref 70–99)
Potassium: 3.8 mmol/L (ref 3.5–5.1)
Sodium: 140 mmol/L (ref 135–145)

## 2023-07-31 LAB — CBC
HCT: 30.9 % — ABNORMAL LOW (ref 36.0–46.0)
Hemoglobin: 9.4 g/dL — ABNORMAL LOW (ref 12.0–15.0)
MCH: 24.7 pg — ABNORMAL LOW (ref 26.0–34.0)
MCHC: 30.4 g/dL (ref 30.0–36.0)
MCV: 81.3 fL (ref 80.0–100.0)
Platelets: 411 10*3/uL — ABNORMAL HIGH (ref 150–400)
RBC: 3.8 MIL/uL — ABNORMAL LOW (ref 3.87–5.11)
RDW: 16.8 % — ABNORMAL HIGH (ref 11.5–15.5)
WBC: 8.8 10*3/uL (ref 4.0–10.5)
nRBC: 0 % (ref 0.0–0.2)

## 2023-07-31 LAB — LIPASE, BLOOD: Lipase: 47 U/L (ref 11–51)

## 2023-07-31 MED ORDER — ALBUTEROL SULFATE (2.5 MG/3ML) 0.083% IN NEBU: 10.0000 mg/h | INHALATION_SOLUTION | Freq: Once | RESPIRATORY_TRACT | Status: DC

## 2023-07-31 MED ORDER — IPRATROPIUM BROMIDE 0.02 % IN SOLN
0.5000 mg | Freq: Once | RESPIRATORY_TRACT | Status: AC
  Administered 2023-07-31: 0.5 mg via RESPIRATORY_TRACT
  Filled 2023-07-31: qty 2.5

## 2023-07-31 MED ORDER — DEXAMETHASONE SODIUM PHOSPHATE 10 MG/ML IJ SOLN
10.0000 mg | Freq: Once | INTRAMUSCULAR | Status: AC
  Administered 2023-07-31: 10 mg via INTRAVENOUS
  Filled 2023-07-31: qty 1

## 2023-07-31 MED ORDER — ALBUTEROL (5 MG/ML) CONTINUOUS INHALATION SOLN
INHALATION_SOLUTION | RESPIRATORY_TRACT | Status: AC
  Filled 2023-07-31: qty 20

## 2023-07-31 MED ORDER — ALBUTEROL SULFATE HFA 108 (90 BASE) MCG/ACT IN AERS
2.0000 | INHALATION_SPRAY | RESPIRATORY_TRACT | Status: DC | PRN
  Administered 2023-07-31 – 2023-08-01 (×2): 2 via RESPIRATORY_TRACT
  Filled 2023-07-31: qty 6.7

## 2023-07-31 NOTE — ED Triage Notes (Addendum)
Pt arrives to ED c/o asthma attack x several hours. Pt with CP and SOB. Pt reports that she may have been around excess dust that caused these symptoms. Pt with labored breathing and expiratory wheeze. Pt unable to find rescue inhaler

## 2023-07-31 NOTE — ED Provider Notes (Signed)
[place] EMERGENCY DEPARTMENT AT [facility] Provider Note   CSN: [id number] Arrival date & time: 07/31/23  2115     History {Add pertinent medical, surgical, social history, OB history to HPI:1} Chief Complaint  Patient presents with   Asthma   Shortness of Breath    Debbie Stewart is a 27 y.o. female.  HPI     This is a 27 year old female who presents with concern for asthma exacerbation.  Just finished a course of steroids for recent asthma exacerbation.  She states that she was cleaning her duplex and it was quite dusty.  She had acute onset of shortness of breath.  She did not have a rescue inhaler at that time.  She states that she feels like there is something sitting on her chest.  No recent fevers or cough.  Home Medications Prior to Admission medications   Medication Sig Start Date End Date Taking? Authorizing Provider  EPINEPHrine 0.3 mg/0.3 mL IJ SOAJ injection Inject 0.3 mg into the muscle as needed for anaphylaxis. 07/25/23   Charlynne Pander, MD  famotidine (PEPCID) 10 MG tablet Take 1 tablet (10 mg total) by mouth 2 (two) times daily. 06/20/22   Carlynn Herald, CNM  famotidine (PEPCID) 20 MG tablet Take 1 tablet (20 mg total) by mouth 2 (two) times daily. 07/25/23   Prosperi, Christian H, PA-C  hydrOXYzine (ATARAX) 25 MG tablet Take 1 tablet (25 mg total) by mouth every 8 (eight) hours as needed. Patient taking differently: Take 25 mg by mouth 2 (two) times daily. Makes her too tired to take every 8 hrs 03/19/22   Marita Kansas, PA-C  omeprazole (PRILOSEC) 20 MG capsule Take 1 capsule (20 mg total) by mouth daily for 20 days. 07/27/21 08/16/21  Cheryll Cockayne, MD  ondansetron (ZOFRAN) 4 MG tablet Take 1 tablet (4 mg total) by mouth every 6 (six) hours. 02/24/23   Gareth Eagle, PA-C  predniSONE (DELTASONE) 20 MG tablet Take 2 tablets (40 mg total) by mouth daily. 07/25/23   Prosperi, Christian H, PA-C  scopolamine (TRANSDERM-SCOP) 1 MG/3DAYS Place 1  patch (1.5 mg total) onto the skin every 3 (three) days. 06/20/22   Carlynn Herald, CNM      Allergies    Shellfish allergy    Review of Systems   Review of Systems  Constitutional:  Negative for fever.  Respiratory:  Positive for chest tightness, shortness of breath and wheezing. Negative for cough.   All other systems reviewed and are negative.   Physical Exam Updated Vital Signs BP (!) 139/115 (BP Location: Right Arm)   Pulse 83   Temp 98.6 F (37 C) (Oral)   Resp (!) 25   Ht 1.676 m (5\' 6" )   Wt 70.3 kg   SpO2 100%   BMI 25.02 kg/m  Physical Exam Vitals and nursing note reviewed.  Constitutional:      Appearance: She is well-developed.  HENT:     Head: Normocephalic and atraumatic.  Eyes:     Pupils: Pupils are equal, round, and reactive to light.  Cardiovascular:     Rate and Rhythm: Normal rate and regular rhythm.     Heart sounds: Normal heart sounds.  Pulmonary:     Effort: Tachypnea and respiratory distress present.     Breath sounds: Wheezing present.     Comments: Speaking in short sentences, tachypnea noted, fair air movement with inspiratory and expiratory wheezing Abdominal:     General: Bowel sounds are  normal.     Palpations: Abdomen is soft.  Musculoskeletal:     Cervical back: Neck supple.  Skin:    General: Skin is warm and dry.  Neurological:     Mental Status: She is alert and oriented to person, place, and time.  Psychiatric:        Mood and Affect: Mood normal.     ED Results / Procedures / Treatments   Labs (all labs ordered are listed, but only abnormal results are displayed) Labs Reviewed  CBC - Abnormal; Notable for the following components:      Result Value   RBC 3.80 (*)    Hemoglobin 9.4 (*)    HCT 30.9 (*)    MCH 24.7 (*)    RDW 16.8 (*)    Platelets 411 (*)    All other components within normal limits  BASIC METABOLIC PANEL  LIPASE, BLOOD    EKG EKG Interpretation Date/Time:  Tuesday July 31 2023  22:14:40 EDT Ventricular Rate:  77 PR Interval:  142 QRS Duration:  88 QT Interval:  378 QTC Calculation: 427 R Axis:   76  Text Interpretation: Normal sinus rhythm Low voltage QRS Cannot rule out Anterior infarct , age undetermined Abnormal ECG When compared with ECG of 25-Jul-2023 17:36, PREVIOUS ECG IS PRESENT Confirmed by Ross Marcus ([id number]) on 07/31/2023 11:03:11 PM  Radiology DG Chest 2 View  Result Date: 07/31/2023 CLINICAL DATA:  Chest pain EXAM: CHEST - 2 VIEW COMPARISON:  03/19/2022 FINDINGS: The heart size and mediastinal contours are within normal limits. Both lungs are clear. The visualized skeletal structures are unremarkable. IMPRESSION: No active cardiopulmonary disease. Electronically Signed   By: Alcide Clever M.D.   On: 07/31/2023 23:15    Procedures Procedures  {Document cardiac monitor, telemetry assessment procedure when appropriate:1}  Medications Ordered in ED Medications  albuterol (VENTOLIN HFA) 108 (90 Base) MCG/ACT inhaler 2 puff (2 puffs Inhalation Given 07/31/23 2217)  albuterol (PROVENTIL,VENTOLIN) solution continuous neb (has no administration in time range)  ipratropium (ATROVENT) nebulizer solution 0.5 mg (has no administration in time range)  dexamethasone (DECADRON) injection 10 mg (has no administration in time range)    ED Course/ Medical Decision Making/ A&P   {   Click here for ABCD2, HEART and other calculatorsREFRESH Note before signing :1}                              Medical Decision Making Amount and/or Complexity of Data Reviewed Labs: ordered. Radiology: ordered.  Risk Prescription drug management.   ***  {Document critical care time when appropriate:1} {Document review of labs and clinical decision tools ie heart score, Chads2Vasc2 etc:1}  {Document your independent review of radiology images, and any outside records:1} {Document your discussion with family members, caretakers, and with consultants:1} {Document social  determinants of health affecting pt's care:1} {Document your decision making why or why not admission, treatments were needed:1} Final Clinical Impression(s) / ED Diagnoses Final diagnoses:  None    Rx / DC Orders ED Discharge Orders     None

## 2023-08-01 ENCOUNTER — Observation Stay (HOSPITAL_COMMUNITY): Payer: BC Managed Care – PPO

## 2023-08-01 DIAGNOSIS — J4521 Mild intermittent asthma with (acute) exacerbation: Secondary | ICD-10-CM | POA: Diagnosis not present

## 2023-08-01 DIAGNOSIS — J45901 Unspecified asthma with (acute) exacerbation: Secondary | ICD-10-CM | POA: Diagnosis present

## 2023-08-01 DIAGNOSIS — R061 Stridor: Secondary | ICD-10-CM | POA: Diagnosis not present

## 2023-08-01 LAB — HIV ANTIBODY (ROUTINE TESTING W REFLEX): HIV Screen 4th Generation wRfx: NONREACTIVE

## 2023-08-01 MED ORDER — LORAZEPAM 1 MG PO TABS
1.0000 mg | ORAL_TABLET | Freq: Once | ORAL | Status: AC
  Administered 2023-08-01: 1 mg via ORAL
  Filled 2023-08-01: qty 1

## 2023-08-01 MED ORDER — AEROCHAMBER PLUS FLO-VU LARGE MISC
Status: AC
  Administered 2023-08-01: 1
  Filled 2023-08-01: qty 1

## 2023-08-01 MED ORDER — ALBUTEROL SULFATE (2.5 MG/3ML) 0.083% IN NEBU
2.5000 mg | INHALATION_SOLUTION | RESPIRATORY_TRACT | Status: DC | PRN
  Administered 2023-08-01 – 2023-08-02 (×6): 2.5 mg via RESPIRATORY_TRACT
  Filled 2023-08-01 (×6): qty 3

## 2023-08-01 MED ORDER — OXYMETAZOLINE HCL 0.05 % NA SOLN: 1.0000 | Freq: Two times a day (BID) | NASAL | Status: DC | PRN

## 2023-08-01 MED ORDER — MAGNESIUM SULFATE 2 GM/50ML IV SOLN
2.0000 g | Freq: Once | INTRAVENOUS | Status: AC
  Administered 2023-08-01: 2 g via INTRAVENOUS
  Filled 2023-08-01: qty 50

## 2023-08-01 MED ORDER — MOMETASONE FURO-FORMOTEROL FUM 200-5 MCG/ACT IN AERO
2.0000 | INHALATION_SPRAY | Freq: Two times a day (BID) | RESPIRATORY_TRACT | Status: DC
  Administered 2023-08-01 – 2023-08-02 (×4): 2 via RESPIRATORY_TRACT
  Filled 2023-08-01 (×3): qty 8.8

## 2023-08-01 MED ORDER — MIDAZOLAM HCL 2 MG/2ML IJ SOLN
2.0000 mg | Freq: Once | INTRAMUSCULAR | Status: AC
  Administered 2023-08-01: 2 mg via INTRAVENOUS
  Filled 2023-08-01: qty 2

## 2023-08-01 MED ORDER — IPRATROPIUM-ALBUTEROL 0.5-2.5 (3) MG/3ML IN SOLN
3.0000 mL | Freq: Once | RESPIRATORY_TRACT | Status: AC
  Administered 2023-08-01: 3 mL via RESPIRATORY_TRACT
  Filled 2023-08-01: qty 3

## 2023-08-01 MED ORDER — FAMOTIDINE IN NACL 20-0.9 MG/50ML-% IV SOLN
20.0000 mg | Freq: Two times a day (BID) | INTRAVENOUS | Status: DC
  Administered 2023-08-01 – 2023-08-03 (×5): 20 mg via INTRAVENOUS
  Filled 2023-08-01 (×5): qty 50

## 2023-08-01 MED ORDER — LORAZEPAM 1 MG PO TABS
1.0000 mg | ORAL_TABLET | ORAL | Status: DC | PRN
  Administered 2023-08-01 – 2023-08-02 (×3): 1 mg via ORAL
  Filled 2023-08-01 (×4): qty 1

## 2023-08-01 MED ORDER — METHYLPREDNISOLONE SODIUM SUCC 125 MG IJ SOLR
80.0000 mg | INTRAMUSCULAR | Status: DC
  Administered 2023-08-01 – 2023-08-02 (×2): 80 mg via INTRAVENOUS
  Filled 2023-08-01 (×2): qty 2

## 2023-08-01 MED ORDER — LORAZEPAM 1 MG PO TABS
1.0000 mg | ORAL_TABLET | Freq: Four times a day (QID) | ORAL | Status: DC | PRN
  Administered 2023-08-01: 1 mg via ORAL
  Filled 2023-08-01 (×2): qty 1

## 2023-08-01 MED ORDER — ENOXAPARIN SODIUM 40 MG/0.4ML IJ SOSY
40.0000 mg | PREFILLED_SYRINGE | INTRAMUSCULAR | Status: DC
  Administered 2023-08-01 – 2023-08-03 (×3): 40 mg via SUBCUTANEOUS
  Filled 2023-08-01 (×3): qty 0.4

## 2023-08-01 MED ORDER — INFLUENZA VIRUS VACC SPLIT PF (FLUZONE) 0.5 ML IM SUSY: 0.5000 mL | PREFILLED_SYRINGE | INTRAMUSCULAR | Status: DC

## 2023-08-01 NOTE — Assessment & Plan Note (Signed)
Lung wheezing and air movement significantly improved after neb treatments and steroids. COPD pathway Tele monitor Cont pulse ox PRN SABA Scheduled LABA and inhaled steroid Continue solumedrol

## 2023-08-01 NOTE — Assessment & Plan Note (Addendum)
Inspiratory stridor, unclear if this is upper airway edema due to allergic reaction, or just VCD due to anxiety. EDP thinks that this is primarily anxiety, ordered benzos. Stridor and respiratory distress seems to resolve when ever she falls asleep from the versed. Will get CT neck soft tissues to make sure no evidence of upper airway obstruction / edema.

## 2023-08-01 NOTE — H&P (Signed)
History and Physical    Patient: Debbie Stewart DOB: 1996-10-12 DOA: 07/31/2023 DOS: the patient was seen and examined on 08/01/2023 PCP: [facility]  Patient coming from: Home  Chief Complaint:  Chief Complaint  Patient presents with   Asthma   Shortness of Breath   HPI: Debbie Stewart is a 27 y.o. female with medical history significant of asthma, anxiety, PNES, menorrhagia with anemia.  Pt with h/o anaphylaxis to shellfish ED visit on 9/25.  Pt in to ED today with wheezing, SOB, respiratory distress.  Given steroids, neb treatments.  Air movement profoundly improved, though pt tachycardic, and now having upper air way stridor.  EDP then gave benzos for possible panic reaction.  This appears to help as stridor resolves and breathing becomes much better when pt falls asleep.   Review of Systems: As mentioned in the history of present illness. All other systems reviewed and are negative. Past Medical History:  Diagnosis Date   Anemia    Anxiety    Asthma    Depression    Fibroid    Hyperthyroidism    Ovarian cyst    Seizures (HCC)    psuedo seizures,  stress related   Past Surgical History:  Procedure Laterality Date   DILATION AND CURETTAGE OF UTERUS     WISDOM TOOTH EXTRACTION     Social History:  reports that she has never smoked. She has never used smokeless tobacco. She reports that she does not currently use alcohol after a past usage of about 2.0 standard drinks of alcohol per week. She reports that she does not currently use drugs after having used the following drugs: Marijuana.  Allergies  Allergen Reactions   Shellfish Allergy Anaphylaxis    Family History  Problem Relation Age of Onset   Hypertension Mother    Sickle cell trait Mother    Liver disease Mother    Healthy Father     Prior to Admission medications   Medication Sig Start Date End Date Taking? Authorizing Provider  EPINEPHrine 0.3 mg/0.3 mL IJ  SOAJ injection Inject 0.3 mg into the muscle as needed for anaphylaxis. 07/25/23   Charlynne Pander, MD  famotidine (PEPCID) 10 MG tablet Take 1 tablet (10 mg total) by mouth 2 (two) times daily. 06/20/22   Carlynn Herald, CNM  famotidine (PEPCID) 20 MG tablet Take 1 tablet (20 mg total) by mouth 2 (two) times daily. 07/25/23   Prosperi, Christian H, PA-C  hydrOXYzine (ATARAX) 25 MG tablet Take 1 tablet (25 mg total) by mouth every 8 (eight) hours as needed. Patient taking differently: Take 25 mg by mouth 2 (two) times daily. Makes her too tired to take every 8 hrs 03/19/22   Marita Kansas, PA-C  omeprazole (PRILOSEC) 20 MG capsule Take 1 capsule (20 mg total) by mouth daily for 20 days. 07/27/21 08/16/21  Cheryll Cockayne, MD  ondansetron (ZOFRAN) 4 MG tablet Take 1 tablet (4 mg total) by mouth every 6 (six) hours. 02/24/23   Gareth Eagle, PA-C  predniSONE (DELTASONE) 20 MG tablet Take 2 tablets (40 mg total) by mouth daily. 07/25/23   Prosperi, Christian H, PA-C  scopolamine (TRANSDERM-SCOP) 1 MG/3DAYS Place 1 patch (1.5 mg total) onto the skin every 3 (three) days. 06/20/22   Carlynn Herald, CNM    Physical Exam: Vitals:   08/01/23 0115 08/01/23 0330 08/01/23 0418 08/01/23 0430  BP: 138/63 128/63 (!) 128/59 138/72  Pulse: (!) 113 (!) 105 Marland Kitchen)  122 (!) 124  Resp:   19 (!) 34  Temp:   97.9 F (36.6 C)   TempSrc:   Oral   SpO2: 100% 100% 100% 100%  Weight:      Height:       Constitutional: NAD, calm, comfortable Respiratory: Maybe a few wheezes, but overall good air movement, has upper airway stridor when awake and accessory muscle movement when awake, both of these resolve when she falls alseep.  Satting 100% on RA. Cardiovascular: Regular rate and rhythm, no murmurs / rubs / gallops. No extremity edema. 2+ pedal pulses. No carotid bruits.  Abdomen: no tenderness, no masses palpated. No hepatosplenomegaly. Bowel sounds positive.  Neurologic: CN 2-12 grossly intact. Sensation intact,  DTR normal. Strength 5/5 in all 4.  Psychiatric: Normal judgment and insight. Alert and oriented x 3. Normal mood.   Data Reviewed:    Labs on Admission: I have personally reviewed following labs and imaging studies  CBC: Recent Labs  Lab 07/31/23 2225  WBC 8.8  HGB 9.4*  HCT 30.9*  MCV 81.3  PLT 411*   Basic Metabolic Panel: Recent Labs  Lab 07/31/23 2225  NA 140  K 3.8  CL 104  CO2 24  GLUCOSE 95  BUN 10  CREATININE 0.68  CALCIUM 9.1   GFR: Estimated Creatinine Clearance: 98 mL/min (by C-G formula based on SCr of 0.68 mg/dL). Liver Function Tests: No results for input(s): "AST", "ALT", "ALKPHOS", "BILITOT", "PROT", "ALBUMIN" in the last 168 hours. Recent Labs  Lab 07/31/23 2225  LIPASE 47   No results for input(s): "AMMONIA" in the last 168 hours. Coagulation Profile: No results for input(s): "INR", "PROTIME" in the last 168 hours. Cardiac Enzymes: No results for input(s): "CKTOTAL", "CKMB", "CKMBINDEX", "TROPONINI" in the last 168 hours. BNP (last 3 results) No results for input(s): "PROBNP" in the last 8760 hours. HbA1C: No results for input(s): "HGBA1C" in the last 72 hours. CBG: No results for input(s): "GLUCAP" in the last 168 hours. Lipid Profile: No results for input(s): "CHOL", "HDL", "LDLCALC", "TRIG", "CHOLHDL", "LDLDIRECT" in the last 72 hours. Thyroid Function Tests: No results for input(s): "TSH", "T4TOTAL", "FREET4", "T3FREE", "THYROIDAB" in the last 72 hours. Anemia Panel: No results for input(s): "VITAMINB12", "FOLATE", "FERRITIN", "TIBC", "IRON", "RETICCTPCT" in the last 72 hours. Urine analysis:    Component Value Date/Time   COLORURINE YELLOW 06/12/2023 1034   APPEARANCEUR HAZY (A) 06/12/2023 1034   LABSPEC 1.011 06/12/2023 1034   PHURINE 5.0 06/12/2023 1034   GLUCOSEU NEGATIVE 06/12/2023 1034   HGBUR LARGE (A) 06/12/2023 1034   BILIRUBINUR NEGATIVE 06/12/2023 1034   KETONESUR NEGATIVE 06/12/2023 1034   PROTEINUR NEGATIVE  06/12/2023 1034   NITRITE NEGATIVE 06/12/2023 1034   LEUKOCYTESUR NEGATIVE 06/12/2023 1034    Radiological Exams on Admission: DG Chest 2 View  Result Date: 07/31/2023 CLINICAL DATA:  Chest pain EXAM: CHEST - 2 VIEW COMPARISON:  03/19/2022 FINDINGS: The heart size and mediastinal contours are within normal limits. Both lungs are clear. The visualized skeletal structures are unremarkable. IMPRESSION: No active cardiopulmonary disease. Electronically Signed   By: Alcide Clever M.D.   On: 07/31/2023 23:15    EKG: Independently reviewed.   Assessment and Plan: * Asthma exacerbation Lung wheezing and air movement significantly improved after neb treatments and steroids. COPD pathway Tele monitor Cont pulse ox PRN SABA Scheduled LABA and inhaled steroid Continue solumedrol  Stridor Inspiratory stridor, unclear if this is upper airway edema due to allergic reaction, or just VCD due to  anxiety. EDP thinks that this is primarily anxiety, ordered benzos. Stridor and respiratory distress seems to resolve when ever she falls asleep from the versed. Will get CT neck soft tissues to make sure no evidence of upper airway obstruction / edema.      Advance Care Planning:   Code Status: Full Code  Consults: None  Family Communication: No family in room  Severity of Illness: The appropriate patient status for this patient is OBSERVATION. Observation status is judged to be reasonable and necessary in order to provide the required intensity of service to ensure the patient's safety. The patient's presenting symptoms, physical exam findings, and initial radiographic and laboratory data in the context of their medical condition is felt to place them at decreased risk for further clinical deterioration. Furthermore, it is anticipated that the patient will be medically stable for discharge from the hospital within 2 midnights of admission.   Author: Hillary Bow., DO 08/01/2023 4:59 AM  For on  call review [email or web address].

## 2023-08-01 NOTE — ED Notes (Addendum)
Pt having an active nose bleed, instructed to hold pressure; bleeding controlled with pressure. Pt states she just started bleeding after her last neb treatment. Provider made aware.

## 2023-08-01 NOTE — Evaluation (Signed)
Physical Therapy Treatment Patient Details Name: Debbie Stewart MRN: [id number] DOB: 24-Dec-1995 Today's Date: 08/01/2023   History of Present Illness Pt is a 27 yo female admitted wtih SOB, wheezing and in respirtatory distress.  PHM: anaphylactic reaction early Sept, anxiety and asthma    PT Comments  Pt admitted with above diagnosis. PTA pt lived alone, employed, active, and independent. Pt currently with functional limitations due to the deficits listed below (see PT Problem List). On eval, she required supervision bed mobility, and CGA sit to stand. Unable to progress gait due to DOE and tachycardia. Pt on 6L continuous O2. SpO2 100%. Continuous wheezing noted. Resting HR 117. Up to 139 static stand beside stretcher, with noted increased anxiety and DOE. Pt will benefit from acute skilled PT to increase their independence and safety with mobility to allow discharge. PT to follow acutely. Suspect she will mobilize well after medical management of asthma exacerbation and require no follow up PT services.     If plan is discharge home, recommend the following:     Can travel by private vehicle        Equipment Recommendations  None recommended by PT    Recommendations for Other Services       Precautions / Restrictions Precautions Precautions: Other (comment) Precaution Comments: watch SpO2 and HR Restrictions Weight Bearing Restrictions: No     Mobility  Bed Mobility Overal bed mobility: Needs Assistance Bed Mobility: Supine to Sit, Sit to Supine     Supine to sit: Supervision Sit to supine: Supervision   General bed mobility comments: for safety, no physical assist    Transfers Overall transfer level: Needs assistance Equipment used: None Transfers: Sit to/from Stand Sit to Stand: Contact guard assist           General transfer comment: CG for safety due to anxiety and SOB. HR up to 139 static standing.    Ambulation/Gait               General Gait  Details: unable due to SOB and tachycardia   Stairs             Wheelchair Mobility     Tilt Bed    Modified Rankin (Stroke Patients Only)       Balance Overall balance assessment: No apparent balance deficits (not formally assessed)                                          Cognition Arousal: Alert Behavior During Therapy: Anxious Overall Cognitive Status: Within Functional Limits for tasks assessed                                 General Comments: independent PTA working 3 jobs        Exercises      General Comments General comments (skin integrity, edema, etc.): SpO2 100% on 6L. SOB/DOE with minimal activity. HR up to 139 with static stand.      Pertinent Vitals/Pain Pain Assessment Pain Assessment: Faces Faces Pain Scale: Hurts little more Pain Location: lungs/chest Pain Descriptors / Indicators: Tightness, Squeezing Pain Intervention(s): Limited activity within patient's tolerance, Monitored during session, Repositioned    Home Living Family/patient expects to be discharged to:: Private residence Living Arrangements: Alone Available Help at Discharge: Family;Available 24 hours/day Type of Home: House Home Access: Stairs  to enter   Entrance Stairs-Number of Steps: 2   Home Layout: One level Home Equipment: None      Prior Function            PT Goals (current goals can now be found in the care plan section) Acute Rehab PT Goals Patient Stated Goal: breathe better PT Goal Formulation: With patient Time For Goal Achievement: 08/15/23 Potential to Achieve Goals: Good    Frequency    Min 1X/week      PT Plan      Co-evaluation              AM-PAC PT "6 Clicks" Mobility   Outcome Measure  Help needed turning from your back to your side while in a flat bed without using bedrails?: None Help needed moving from lying on your back to sitting on the side of a flat bed without using bedrails?: A  Little Help needed moving to and from a bed to a chair (including a wheelchair)?: A Little Help needed standing up from a chair using your arms (e.g., wheelchair or bedside chair)?: A Little Help needed to walk in hospital room?: A Little Help needed climbing 3-5 steps with a railing? : A Lot 6 Click Score: 18    End of Session Equipment Utilized During Treatment: Oxygen Activity Tolerance: Treatment limited secondary to medical complications (Comment) (DOE, tachycardia) Patient left: in bed;with call bell/phone within reach Nurse Communication: Mobility status PT Visit Diagnosis: Other abnormalities of gait and mobility (R26.89)     Time: 4401-0272 PT Time Calculation (min) (ACUTE ONLY): 14 min  Charges:      PT General Charges $$ ACUTE PT VISIT: 1 Visit                     Ferd Glassing., PT  Office # [phone]    Ilda Foil 08/01/2023, 11:54 AM

## 2023-08-01 NOTE — ED Notes (Signed)
ENT provider at bedside.  

## 2023-08-01 NOTE — Progress Notes (Signed)
Patient admitted after midnight- please see H&P.  Recent treatment for allergic reaction    Asthma exacerbation -continues to have wheezing and c/o difficulty breathing -nebs, steroids -PCCM consult   Stridor Inspiratory stridor- per chart review, goes away with sleeping  CT neck soft tissues is normal  Marlin Canary DO

## 2023-08-01 NOTE — Evaluation (Signed)
Occupational Therapy Evaluation Patient Details Name: Debbie Stewart MRN: [id number] DOB: 04/12/1996 Today's Date: 08/01/2023   History of Present Illness Pt is a 27 yo female admitted wtih SOB, wheezing and in respirtatory distress.  PHM: anaphylactic reaction early Sept, anxiety and asthma   Clinical Impression   Pt admitted with the above diagnosis and has the deficits listed below. Pt is most likely very close to baseline independent adl status but because of wheezing, SOB, anxiety and HR in 130s with activity pt is unable to tolerate much in the way of adls. Feel pt will perform adls well once medical management is complete.  Will continue to follow to ensure independence with adls prior to returning home as pt lives alone and has three jobs.  Pt's father is local and can assist.        If plan is discharge home, recommend the following: A little help with walking and/or transfers;A little help with bathing/dressing/bathroom;Assistance with cooking/housework;Assist for transportation;Help with stairs or ramp for entrance    Functional Status Assessment  Patient has had a recent decline in their functional status and demonstrates the ability to make significant improvements in function in a reasonable and predictable amount of time.  Equipment Recommendations  None recommended by OT    Recommendations for Other Services       Precautions / Restrictions Precautions Precautions: Other (comment) (HR in 130s) Precaution Comments: HR in 130s Restrictions Weight Bearing Restrictions: No      Mobility Bed Mobility Overal bed mobility: Needs Assistance Bed Mobility: Supine to Sit, Sit to Supine     Supine to sit: Supervision Sit to supine: Supervision   General bed mobility comments: no physical assist needed    Transfers Overall transfer level: Needs assistance Equipment used: 1 person hand held assist Transfers: Sit to/from Stand Sit to Stand: Contact guard assist            General transfer comment: CG for anxiety and SOB      Balance Overall balance assessment: No apparent balance deficits (not formally assessed)                                         ADL either performed or assessed with clinical judgement   ADL Overall ADL's : Needs assistance/impaired Eating/Feeding: Sitting;Minimal assistance Eating/Feeding Details (indicate cue type and reason): issues with swallowing due to wheezing and pain in esophogus area Grooming: Wash/dry hands;Wash/dry face;Oral care;Set up;Sitting   Upper Body Bathing: Minimal assistance;Sitting Upper Body Bathing Details (indicate cue type and reason): rest breaks due to SOB Lower Body Bathing: Minimal assistance;Sit to/from stand;Cueing for compensatory techniques Lower Body Bathing Details (indicate cue type and reason): breaks needed due to SOB Upper Body Dressing : Set up;Sitting   Lower Body Dressing: Minimal assistance;Sit to/from stand;Cueing for sequencing Lower Body Dressing Details (indicate cue type and reason): limited due to increased HR Toilet Transfer: Contact guard assist;BSC/3in1;Stand-pivot Toilet Transfer Details (indicate cue type and reason): limited due to HR Toileting- Clothing Manipulation and Hygiene: Contact guard assist;Sit to/from stand;Cueing for compensatory techniques       Functional mobility during ADLs: Contact guard assist General ADL Comments: Pt only limited due to trouble breathing and anxious.     Vision Baseline Vision/History: 0 No visual deficits Ability to See in Adequate Light: 0 Adequate Patient Visual Report: No change from baseline Vision Assessment?: No apparent visual deficits  Perception Perception: Within Functional Limits       Praxis Praxis: WFL       Pertinent Vitals/Pain Pain Assessment Pain Assessment: Faces Faces Pain Scale: Hurts little more Pain Location: lungs Pain Descriptors / Indicators: Tightness Pain  Intervention(s): Limited activity within patient's tolerance, Monitored during session, Repositioned     Extremity/Trunk Assessment Upper Extremity Assessment Upper Extremity Assessment: Overall WFL for tasks assessed   Lower Extremity Assessment Lower Extremity Assessment: Defer to PT evaluation   Cervical / Trunk Assessment Cervical / Trunk Assessment: Normal   Communication Communication Communication: No apparent difficulties;Other (comment) (wheezing making talking difficult)   Cognition Arousal: Alert Behavior During Therapy: Anxious Overall Cognitive Status: Within Functional Limits for tasks assessed                                 General Comments: independent PTA working 3 jobs     General Comments  Need further eval of balance due to pt's HR being up to 140 with activity.    Exercises     Shoulder Instructions      Home Living Family/patient expects to be discharged to:: Private residence Living Arrangements: Alone Available Help at Discharge: Family;Available 24 hours/day Type of Home: House Home Access: Stairs to enter [employer] of Steps: 2   Home Layout: One level     Bathroom Shower/Tub: Producer, television/film/video: Standard     Home Equipment: None          Prior Functioning/Environment Prior Level of Function : Independent/Modified Independent             Mobility Comments: no assist ADLs Comments: saving money for car so has a driver        OT Problem List: Decreased activity tolerance;Impaired balance (sitting and/or standing);Cardiopulmonary status limiting activity;Pain      OT Treatment/Interventions: Self-care/ADL training;Energy conservation;Therapeutic activities;Balance training    OT Goals(Current goals can be found in the care plan section) Acute Rehab OT Goals Patient Stated Goal: to breathe better OT Goal Formulation: With patient Time For Goal Achievement: 08/15/23 Potential to  Achieve Goals: Good ADL Goals Additional ADL Goal #1: Pt will walk to bathroom and toilet independently. Additional ADL Goal #2: Pt will gather clothing and dress self Ily with one rest break. Additional ADL Goal #3: Pt will state 3 things she can do at home when becoming SOB during adls to conserve energy and attempt to relax without cues.  OT Frequency: Min 2X/week    Co-evaluation              AM-PAC OT "6 Clicks" Daily Activity     Outcome Measure Help from another person eating meals?: A Little Help from another person taking care of personal grooming?: A Little Help from another person toileting, which includes using toliet, bedpan, or urinal?: A Little Help from another person bathing (including washing, rinsing, drying)?: A Little Help from another person to put on and taking off regular upper body clothing?: A Little Help from another person to put on and taking off regular lower body clothing?: A Little 6 Click Score: 18   End of Session Nurse Communication: Mobility status  Activity Tolerance: Patient limited by fatigue Patient left: in bed;with call bell/phone within reach  OT Visit Diagnosis: Pain;Other (comment) (SOB) Pain - part of body:  (lungs)  Time: 1610-9604 OT Time Calculation (min): 13 min Charges:  OT General Charges $OT Visit: 1 Visit OT Evaluation $OT Eval Low Complexity: 1 Low  Hope Budds 08/01/2023, 10:17 AM

## 2023-08-01 NOTE — Consult Note (Addendum)
NAME:  Debbie Stewart, MRN:  [id number], DOB:  09/26/1996, LOS: 0 ADMISSION DATE:  07/31/2023, CONSULTATION DATE:  08/01/23 REFERRING MD:  Marlin Canary, CHIEF COMPLAINT:  Asthma exacerbation   History of Present Illness:  27 year old female with known asthma, anxiety, anemia, recent anaphylaxis to shellfish admitted with asthma exacerbation.  Patient recently seen in the ED on 9/25 for suspected anaphylaxis related to shellfish, she did not require intubation. She was able to be discharged home with steroids and epi pen.  Today, while cleaning her dusty apartment, she became acutely short of breath and did not have a rescue inhaler available. On arrival to ED, she was noted to have tachypnea with wheezing bilaterally and only able to speak in short sentences.  Vital signs on presentation: Temp 98.6, HR: 83, RR 25, BP: 136/115, SpOw 100% on room air.  She received lorazepam, solumedrol, magnesium, bronchodilators in the ED.   Patient states she was diagnosed with asthma as a child but no issues until recently this summer when she had an asthma exacerbation and since then has had 4 additional exacerbations.  She takes PRN albuterol at home, has never required intubation.  On arrival to eval patient in the ED, she has upper airway noise, no wheezing on exam (she did just receive albuterol treatment).  She is mildly tachycardic with HR 100's and RR in the 20's.    Pertinent  Medical History  Asthma Anxiety PNES  Menorrhagia  Significant Hospital Events: Including procedures, antibiotic start and stop dates in addition to other pertinent events   Admit 10/1  Interim History / Subjective:  New consult  Objective   Blood pressure 127/70, pulse 99, temperature 99.3 F (37.4 C), temperature source Oral, resp. rate (!) 27, height 5\' 6"  (1.676 m), weight 70.3 kg, SpO2 100%, unknown if currently breastfeeding.    FiO2 (%):  [100 %] 100 %  No intake or output data in the 24 hours ending 08/01/23  1159 Filed Weights   07/31/23 2212  Weight: 70.3 kg    Examination: General: Adult female resting in bed, sitting up with mild increased work of breathing HENT: [place]/AT Lungs: Lungs sound clear, upper airway noise, mild increase work of breathing  Cardiovascular: sinus tachycardia on telemetry, warm and well perfused.  No edema. Distal pulses intact.  Abdomen: Soft/non tender/non distended.  Extremities: No deformities Neuro: Alert and oriented, no focal deficits, moves all extremities to antigravity.    Resolved Hospital Problem list     Assessment & Plan:  Asthma exacerbation with concern for stridor - Continue Solumedrol 80 mg IV daily - Continue Dulera - CT Soft tissue neck without contrast: No evidence of swelling or edema with diffusely patent airways.  Clear apical lungs.  - Discussed avoiding precipitating factors such as chemicals/dust - Allergist appointment is planned for November - Continue pepcid - Wean FiO2 to maintain SpO2 > 92%, currently 100% SPO2 - Consult ENT to evaluate for possible vocal cord dysfunction  Generalized anxiety disorder - Home meds: Hydroxyzine 25 mg TID PRN - PRN ativan given during the ED, tolerating well.   Chronic anemia - Baseline hemoglobin: 9-11 - Due to uterine fibroids and follows with OB/GYN as outpatient - Hgb at baseline, no indication for transfusion   Best Practice (right click and "Reselect all SmartList Selections" daily)   Diet/type: NPO w/ oral meds DVT prophylaxis: prophylactic heparin  GI prophylaxis: H2B Lines: N/A Foley:  N/A Code Status:  full code Last date of multidisciplinary goals  of care discussion [discussed with patient]  Labs   CBC: Recent Labs  Lab 07/31/23 2225  WBC 8.8  HGB 9.4*  HCT 30.9*  MCV 81.3  PLT 411*    Basic Metabolic Panel: Recent Labs  Lab 07/31/23 2225  NA 140  K 3.8  CL 104  CO2 24  GLUCOSE 95  BUN 10  CREATININE 0.68  CALCIUM 9.1   GFR: Estimated Creatinine  Clearance: 98 mL/min (by C-G formula based on SCr of 0.68 mg/dL). Recent Labs  Lab 07/31/23 2225  WBC 8.8    Liver Function Tests: No results for input(s): "AST", "ALT", "ALKPHOS", "BILITOT", "PROT", "ALBUMIN" in the last 168 hours. Recent Labs  Lab 07/31/23 2225  LIPASE 47   No results for input(s): "AMMONIA" in the last 168 hours.  ABG No results found for: "PHART", "PCO2ART", "PO2ART", "HCO3", "TCO2", "ACIDBASEDEF", "O2SAT"   Coagulation Profile: No results for input(s): "INR", "PROTIME" in the last 168 hours.  Cardiac Enzymes: No results for input(s): "CKTOTAL", "CKMB", "CKMBINDEX", "TROPONINI" in the last 168 hours.  HbA1C: No results found for: "HGBA1C"  CBG: No results for input(s): "GLUCAP" in the last 168 hours.  Review of Systems:   Shortness of breath with cleaning house, no recent cough/symptoms of infection.   Past Medical History:  She,  has a past medical history of Anemia, Anxiety, Asthma, Depression, Fibroid, Hyperthyroidism, Ovarian cyst, and Seizures (HCC).   Surgical History:   Past Surgical History:  Procedure Laterality Date   DILATION AND CURETTAGE OF UTERUS     WISDOM TOOTH EXTRACTION       Social History:   reports that she has never smoked. She has never used smokeless tobacco. She reports that she does not currently use alcohol after a past usage of about 2.0 standard drinks of alcohol per week. She reports that she does not currently use drugs after having used the following drugs: Marijuana.   Family History:  Her family history includes Healthy in her father; Hypertension in her mother; Liver disease in her mother; Sickle cell trait in her mother.   Allergies Allergies  Allergen Reactions   Shellfish Allergy Anaphylaxis     Home Medications  Prior to Admission medications   Medication Sig Start Date End Date Taking? Authorizing Provider  acetaminophen (TYLENOL) 500 MG tablet Take 1,000 mg by mouth as needed for moderate pain.    Yes [provider]  famotidine (PEPCID) 20 MG tablet Take 1 tablet (20 mg total) by mouth 2 (two) times daily. 07/25/23  Yes Prosperi, Christian H, PA-C  hydrOXYzine (ATARAX) 25 MG tablet Take 1 tablet (25 mg total) by mouth every 8 (eight) hours as needed. Patient taking differently: Take 25 mg by mouth 2 (two) times daily as needed for anxiety. 03/19/22  Yes Ali, Amjad, PA-C  ibuprofen (ADVIL) 200 MG tablet Take 400 mg by mouth as needed for moderate pain.   Yes [provider]  LORazepam (ATIVAN) 0.5 MG tablet Take 0.25 mg by mouth as needed for anxiety.   Yes [provider]  MAGNESIUM PO Take 2 tablets by mouth at bedtime.   Yes [provider]  Multiple Vitamins-Minerals (MULTIVITAMIN WITH MINERALS) tablet Take 1 tablet by mouth daily.   Yes [provider]  EPINEPHrine 0.3 mg/0.3 mL IJ SOAJ injection Inject 0.3 mg into the muscle as needed for anaphylaxis. Patient not taking: Reported on 08/01/2023 07/25/23   Charlynne Pander, MD  predniSONE (DELTASONE) 20 MG tablet Take 2 tablets (40  mg total) by mouth daily. Patient not taking: Reported on 08/01/2023 07/25/23   Olene Floss, PA-C     Critical care time: 62

## 2023-08-01 NOTE — ED Notes (Signed)
Pt reports feeling like she can't breath all the way down in her chest nor push her air out without effort. Reports ending a round of prednisone yesterday.  Reports cleaning yesterday with bleach, baking soda & vinegar yesterday as well prior to difficulty breathing.

## 2023-08-01 NOTE — ED Notes (Signed)
ED TO INPATIENT HANDOFF REPORT  ED Nurse Name and Phone #:  Ave Filter or Morrie Sheldon [phone]  S Name/Age/Gender Debbie Stewart 27 y.o. female Room/Bed: 037C/037C  Code Status   Code Status: Full Code  Home/SNF/Other Home Patient oriented to: self, place, time, and situation Is this baseline? Yes   Triage Complete: Triage complete  Chief Complaint Asthma exacerbation [J45.901]  Triage Note Pt arrives to ED c/o asthma attack x several hours. Pt with CP and SOB. Pt reports that she may have been around excess dust that caused these symptoms. Pt with labored breathing and expiratory wheeze. Pt unable to find rescue inhaler    Allergies Allergies  Allergen Reactions   Shellfish Allergy Anaphylaxis    Level of Care/Admitting Diagnosis ED Disposition     ED Disposition  Admit   Condition  --   Comment  Hospital Area: [facility] [[id number]]  Level of Care: Progressive [102]  Admit to Progressive based on following criteria: RESPIRATORY PROBLEMS hypoxemic/hypercapnic respiratory failure that is responsive to NIPPV (BiPAP) or High Flow Nasal Cannula (6-80 lpm). Frequent assessment/intervention, no > Q2 hrs < Q4 hrs, to maintain oxygenation and pulmonary hygiene.  May place patient in observation at [facility] or [facility] if equivalent level of care is available:: No  Covid Evaluation: Asymptomatic - no recent exposure (last 10 days) testing not required  Diagnosis: Asthma exacerbation [191478]  Admitting Physician: Hillary Bow [[id number]]  Attending Physician: Hillary Bow [[id number]]          B Medical/Surgery History Past Medical History:  Diagnosis Date   Anemia    Anxiety    Asthma    Depression    Fibroid    Hyperthyroidism    Ovarian cyst    Seizures (HCC)    psuedo seizures,  stress related   Past Surgical History:  Procedure Laterality Date   DILATION AND CURETTAGE OF UTERUS     WISDOM TOOTH EXTRACTION       A IV  Location/Drains/Wounds Patient Lines/Drains/Airways Status     Active Line/Drains/Airways     Name Placement date Placement time Site Days   Peripheral IV 08/01/23 20 G Anterior;Left Forearm 08/01/23  1354  Forearm  less than 1            Intake/Output Last 24 hours No intake or output data in the 24 hours ending 08/01/23 1548  Labs/Imaging Results for orders placed or performed during the hospital encounter of 07/31/23 (from the past 48 hour(s))  CBC     Status: Abnormal   Collection Time: 07/31/23 10:25 PM  Result Value Ref Range   WBC 8.8 4.0 - 10.5 K/uL   RBC 3.80 (L) 3.87 - 5.11 MIL/uL   Hemoglobin 9.4 (L) 12.0 - 15.0 g/dL   HCT 21.3 (L) 08.6 - 57.8 %   MCV 81.3 80.0 - 100.0 fL   MCH 24.7 (L) 26.0 - 34.0 pg   MCHC 30.4 30.0 - 36.0 g/dL   RDW 46.9 (H) 62.9 - 52.8 %   Platelets 411 (H) 150 - 400 K/uL   nRBC 0.0 0.0 - 0.2 %    Comment: Performed at [facility], [address], [place], [place] [address]  Basic metabolic panel     Status: None   Collection Time: 07/31/23 10:25 PM  Result Value Ref Range   Sodium 140 135 - 145 mmol/L   Potassium 3.8 3.5 - 5.1 mmol/L   Chloride 104 98 - 111 mmol/L  CO2 24 22 - 32 mmol/L   Glucose, Bld 95 70 - 99 mg/dL    Comment: Glucose reference range applies only to samples taken after fasting for at least 8 hours.   BUN 10 6 - 20 mg/dL   Creatinine, Ser 1.61 0.44 - 1.00 mg/dL   Calcium 9.1 8.9 - 09.6 mg/dL   GFR, Estimated >04 >54 mL/min    Comment: (NOTE) Calculated using the CKD-EPI Creatinine Equation (2021)    Anion gap 12 5 - 15    Comment: Performed at [facility], [address] [place], [place] [address]  Lipase, blood     Status: None   Collection Time: 07/31/23 10:25 PM  Result Value Ref Range   Lipase 47 11 - 51 U/L    Comment: Performed at [facility], [address] [place], [place] [address]  HIV Antibody (routine testing w rflx)     Status: None   Collection Time: 08/01/23  4:20 AM   Result Value Ref Range   HIV Screen 4th Generation wRfx Non Reactive Non Reactive    Comment: Performed at [facility], [address], [place], [place] [address]   CT SOFT TISSUE NECK WO CONTRAST  Result Date: 08/01/2023 CLINICAL DATA:  Asthma and shortness of breath, evaluate for epiglottitis or tonsillitis. EXAM: CT NECK WITHOUT CONTRAST TECHNIQUE: Multidetector CT imaging of the neck was performed following the standard protocol without intravenous contrast. RADIATION DOSE REDUCTION: This exam was performed according to the departmental dose-optimization program which includes automated exposure control, adjustment of the mA and/or kV according to patient size and/or use of iterative reconstruction technique. COMPARISON:  None Available. FINDINGS: Pharynx and larynx: No evidence of swelling or edema. The airways diffusely patent Salivary glands: Unremarkable Thyroid: Normal. Lymph nodes: None enlarged or abnormal density. Vascular: Negative. Limited intracranial: Negative. Visualized orbits: Negative. Mastoids and visualized paranasal sinuses: Clear Skeleton: Unremarkable Upper chest: Clear apical lungs with superimposed motion artifact. IMPRESSION: Negative motion degraded neck CT. Electronically Signed   By: Tiburcio Pea M.D.   On: 08/01/2023 06:27   DG Chest 2 View  Result Date: 07/31/2023 CLINICAL DATA:  Chest pain EXAM: CHEST - 2 VIEW COMPARISON:  03/19/2022 FINDINGS: The heart size and mediastinal contours are within normal limits. Both lungs are clear. The visualized skeletal structures are unremarkable. IMPRESSION: No active cardiopulmonary disease. Electronically Signed   By: Alcide Clever M.D.   On: 07/31/2023 23:15    Pending Labs Unresulted Labs (From admission, onward)     Start     Ordered   08/02/23 0500  CBC  Tomorrow morning,   R        08/01/23 1253   08/02/23 0500  Basic metabolic panel  Tomorrow morning,   R        08/01/23 1253   08/01/23 1153  SARS Coronavirus  2 by RT PCR (hospital order, performed in [facility] hospital lab) *cepheid single result test* Anterior Nasal Swab  (Tier 2 - SARS Coronavirus 2 by RT PCR (hospital order, performed in [facility] hospital lab) *cepheid single result test*)  Once,   R        08/01/23 1152   08/01/23 1150  Respiratory (~20 pathogens) panel by PCR  (Respiratory panel by PCR (~20 pathogens, ~24 hr TAT)  w precautions)  Once,   R        08/01/23 1149            Vitals/Pain  Today's Vitals   08/01/23 1130 08/01/23 1145 08/01/23 1212 08/01/23 1521  BP: 127/80 127/70  (!) 140/63  Pulse: 99  (!) 108 (!) 104  Resp: (!) 24 (!) 27 (!) 28 (!) 22  Temp: 99.3 F (37.4 C)   98.9 F (37.2 C)  TempSrc: Oral   Oral  SpO2: 100%  100% 100%  Weight:      Height:      PainSc:        Isolation Precautions No active isolations  Medications Medications  albuterol (VENTOLIN HFA) 108 (90 Base) MCG/ACT inhaler 2 puff (2 puffs Inhalation Given 08/01/23 0850)  albuterol (PROVENTIL) (2.5 MG/3ML) 0.083% nebulizer solution (10 mg/hr Nebulization Not Given 07/31/23 2335)  methylPREDNISolone sodium succinate (SOLU-MEDROL) 125 mg/2 mL injection 80 mg (80 mg Intravenous Given 08/01/23 0904)  enoxaparin (LOVENOX) injection 40 mg (40 mg Subcutaneous Given 08/01/23 1231)  mometasone-formoterol (DULERA) 200-5 MCG/ACT inhaler 2 puff (2 puffs Inhalation Given 08/01/23 0721)  albuterol (PROVENTIL) (2.5 MG/3ML) 0.083% nebulizer solution 2.5 mg (2.5 mg Nebulization Given 08/01/23 1137)  oxymetazoline (AFRIN) 0.05 % nasal spray 1 spray (has no administration in time range)  LORazepam (ATIVAN) tablet 1 mg (has no administration in time range)  famotidine (PEPCID) IVPB 20 mg premix (20 mg Intravenous New Bag/Given 08/01/23 1355)  ipratropium (ATROVENT) nebulizer solution 0.5 mg (0.5 mg Nebulization Given 07/31/23 2325)  dexamethasone (DECADRON) injection 10 mg (10 mg Intravenous Given 07/31/23 2330)  albuterol (VENTOLIN) (5 MG/ML) 0.5% continuous  inhalation solution (  Given 07/31/23 2335)  magnesium sulfate IVPB 2 g 50 mL (0 g Intravenous Stopped 08/01/23 0158)  ipratropium-albuterol (DUONEB) 0.5-2.5 (3) MG/3ML nebulizer solution 3 mL (3 mLs Nebulization Given 08/01/23 0234)  LORazepam (ATIVAN) tablet 1 mg (1 mg Oral Given 08/01/23 0259)  ipratropium-albuterol (DUONEB) 0.5-2.5 (3) MG/3ML nebulizer solution 3 mL (3 mLs Nebulization Given 08/01/23 0420)  midazolam (VERSED) injection 2 mg (2 mg Intravenous Given 08/01/23 0442)  AeroChamber Plus Flo-Vu Large MISC (1 each  Given 08/01/23 0732)  LORazepam (ATIVAN) tablet 1 mg (1 mg Oral Given 08/01/23 1231)    Mobility walks     Focused Assessments See Chart   R Recommendations: See Admitting Provider Note  Report given to:   Additional Notes: see chart

## 2023-08-01 NOTE — Consult Note (Signed)
Reason for Consult:stridor Referring Physician: Dr Ellwood Sayers Debbie Stewart is an 27 y.o. female.  HPI: hx of asthma attacks since having Covid 6 months ago.  She has had many attacks and treated with meds. 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 She usually has some chest discomfort with these episodes of asthma. SHe also has a weak voice. She has varying amounts of stridor with the episodes. She does not have any swelling this time.   Past Medical History:  Diagnosis Date   Anemia    Anxiety    Asthma    Depression    Fibroid    Hyperthyroidism    Ovarian cyst    Seizures (HCC)    psuedo seizures,  stress related    Past Surgical History:  Procedure Laterality Date   DILATION AND CURETTAGE OF UTERUS     WISDOM TOOTH EXTRACTION      Family History  Problem Relation Age of Onset   Hypertension Mother    Sickle cell trait Mother    Liver disease Mother    Healthy Father     Social History:  reports that she has never smoked. She has never used smokeless tobacco. She reports that she does not currently use alcohol after a past usage of about 2.0 standard drinks of alcohol per week. She reports that she does not currently use drugs after having used the following drugs: Marijuana.  Allergies:  Allergies  Allergen Reactions   Shellfish Allergy Anaphylaxis    Medications: I have reviewed the patient's current medications.  Results for orders placed or performed during the hospital encounter of 07/31/23 (from the past 48 hour(s))  CBC     Status: Abnormal   Collection Time: 07/31/23 10:25 PM  Result Value Ref Range   WBC 8.8 4.0 - 10.5 K/uL   RBC 3.80 (L) 3.87 - 5.11 MIL/uL   Hemoglobin 9.4 (L) 12.0 - 15.0 g/dL   HCT 16.1 (L) 09.6 - 04.5 %   MCV 81.3 80.0 - 100.0 fL   MCH 24.7 (L) 26.0 - 34.0 pg   MCHC 30.4 30.0 - 36.0 g/dL   RDW 40.9 (H) 81.1 - 91.4 %   Platelets 411 (H) 150 - 400 K/uL   nRBC 0.0 0.0 - 0.2 %    Comment:  Performed at [facility], [address], [place], [place] [address]  Basic metabolic panel     Status: None   Collection Time: 07/31/23 10:25 PM  Result Value Ref Range   Sodium 140 135 - 145 mmol/L   Potassium 3.8 3.5 - 5.1 mmol/L   Chloride 104 98 - 111 mmol/L   CO2 24 22 - 32 mmol/L   Glucose, Bld 95 70 - 99 mg/dL    Comment: Glucose reference range applies only to samples taken after fasting for at least 8 hours.   BUN 10 6 - 20 mg/dL   Creatinine, Ser 6.21 0.44 - 1.00 mg/dL   Calcium 9.1 8.9 - 30.8 mg/dL   GFR, Estimated >65 >78 mL/min    Comment: (NOTE) Calculated using the CKD-EPI Creatinine Equation (2021)    Anion gap 12 5 - 15    Comment: Performed at [facility], [address], [place], [place] [address]  Lipase, blood     Status: None   Collection Time: 07/31/23 10:25 PM  Result Value Ref Range   Lipase 47 11 - 51 U/L  Comment: Performed at [facility], [address], [place], [place] [address]  HIV Antibody (routine testing w rflx)     Status: None   Collection Time: 08/01/23  4:20 AM  Result Value Ref Range   HIV Screen 4th Generation wRfx Non Reactive Non Reactive    Comment: Performed at [facility], [address], [place], [place] [address]    CT SOFT TISSUE NECK WO CONTRAST  Result Date: 08/01/2023 CLINICAL DATA:  Asthma and shortness of breath, evaluate for epiglottitis or tonsillitis. EXAM: CT NECK WITHOUT CONTRAST TECHNIQUE: Multidetector CT imaging of the neck was performed following the standard protocol without intravenous contrast. RADIATION DOSE REDUCTION: This exam was performed according to the departmental dose-optimization program which includes automated exposure control, adjustment of the mA and/or kV according to patient size and/or use of iterative reconstruction technique. COMPARISON:  None Available. FINDINGS: Pharynx and larynx: No evidence of swelling or edema. The airways diffusely patent Salivary glands:  Unremarkable Thyroid: Normal. Lymph nodes: None enlarged or abnormal density. Vascular: Negative. Limited intracranial: Negative. Visualized orbits: Negative. Mastoids and visualized paranasal sinuses: Clear Skeleton: Unremarkable Upper chest: Clear apical lungs with superimposed motion artifact. IMPRESSION: Negative motion degraded neck CT. Electronically Signed   By: Tiburcio Pea M.D.   On: 08/01/2023 06:27   DG Chest 2 View  Result Date: 07/31/2023 CLINICAL DATA:  Chest pain EXAM: CHEST - 2 VIEW COMPARISON:  03/19/2022 FINDINGS: The heart size and mediastinal contours are within normal limits. Both lungs are clear. The visualized skeletal structures are unremarkable. IMPRESSION: No active cardiopulmonary disease. Electronically Signed   By: Alcide Clever M.D.   On: 07/31/2023 23:15    ROS Blood pressure 127/70, pulse (!) 108, temperature 99.3 F (37.4 C), temperature source Oral, resp. rate (!) 28, height 5\' 6"  (1.676 m), weight 70.3 kg, SpO2 100%, unknown if currently breastfeeding. Physical Exam HENT:     Head: Normocephalic.     Comments: The nasopharynx is clear. The larynx has no inflammation and vocal cords appear to move freely. They do have some dysfunction with adduction with inspiration and I can observe her having some stridor sound with the closing of the cords. The stridor breaks when her cords open. The stridor was much worse while the scope was in.  Subglottis does not look like there is any inflammation.  Eyes:     Extraocular Movements: Extraocular movements intact.     Pupils: Pupils are equal, round, and reactive to light.  Neurological:     Mental Status: She is alert.       Assessment/Plan: Stridor- it does appear her vocal cords are adducting when she is inspiring. She still has adequate airway. She has no lesions and the subglottis looks opens. I think aggressive reflux meds would be first line for this. BID nexium or Protonix. Laryngology appointment with Dr  Irene Pap [phone] for additional evaluation.   [name] 08/01/2023, 1:58 PM

## 2023-08-01 NOTE — ED Notes (Signed)
Provider at bedside

## 2023-08-02 ENCOUNTER — Observation Stay (HOSPITAL_COMMUNITY): Payer: BC Managed Care – PPO

## 2023-08-02 DIAGNOSIS — R061 Stridor: Secondary | ICD-10-CM | POA: Diagnosis not present

## 2023-08-02 DIAGNOSIS — K219 Gastro-esophageal reflux disease without esophagitis: Secondary | ICD-10-CM | POA: Diagnosis not present

## 2023-08-02 DIAGNOSIS — J45901 Unspecified asthma with (acute) exacerbation: Secondary | ICD-10-CM | POA: Diagnosis not present

## 2023-08-02 DIAGNOSIS — J4521 Mild intermittent asthma with (acute) exacerbation: Secondary | ICD-10-CM | POA: Diagnosis not present

## 2023-08-02 DIAGNOSIS — J383 Other diseases of vocal cords: Secondary | ICD-10-CM | POA: Diagnosis not present

## 2023-08-02 DIAGNOSIS — D72829 Elevated white blood cell count, unspecified: Secondary | ICD-10-CM | POA: Diagnosis not present

## 2023-08-02 LAB — BASIC METABOLIC PANEL
Anion gap: 9 (ref 5–15)
BUN: 8 mg/dL (ref 6–20)
CO2: 22 mmol/L (ref 22–32)
Calcium: 9.2 mg/dL (ref 8.9–10.3)
Chloride: 106 mmol/L (ref 98–111)
Creatinine, Ser: 0.71 mg/dL (ref 0.44–1.00)
GFR, Estimated: >60 mL/min (ref >60)
Glucose, Bld: 88 mg/dL (ref 70–99)
Potassium: 3.7 mmol/L (ref 3.5–5.1)
Sodium: 137 mmol/L (ref 135–145)

## 2023-08-02 LAB — CBC
HCT: 29.7 % — ABNORMAL LOW (ref 36.0–46.0)
Hemoglobin: 9.2 g/dL — ABNORMAL LOW (ref 12.0–15.0)
MCH: 25.2 pg — ABNORMAL LOW (ref 26.0–34.0)
MCHC: 31 g/dL (ref 30.0–36.0)
MCV: 81.4 fL (ref 80.0–100.0)
Platelets: 424 10*3/uL — ABNORMAL HIGH (ref 150–400)
RBC: 3.65 MIL/uL — ABNORMAL LOW (ref 3.87–5.11)
RDW: 16.8 % — ABNORMAL HIGH (ref 11.5–15.5)
WBC: 22.7 10*3/uL — ABNORMAL HIGH (ref 4.0–10.5)
nRBC: 0 % (ref 0.0–0.2)

## 2023-08-02 MED ORDER — ACETAMINOPHEN 325 MG PO TABS: 650.0000 mg | ORAL_TABLET | Freq: Four times a day (QID) | ORAL | Status: DC | PRN

## 2023-08-02 MED ORDER — ALBUTEROL SULFATE (2.5 MG/3ML) 0.083% IN NEBU
2.5000 mg | INHALATION_SOLUTION | RESPIRATORY_TRACT | Status: DC
  Administered 2023-08-02 – 2023-08-03 (×3): 2.5 mg via RESPIRATORY_TRACT
  Filled 2023-08-02 (×6): qty 3

## 2023-08-02 MED ORDER — ALPRAZOLAM 0.5 MG PO TABS
0.5000 mg | ORAL_TABLET | Freq: Two times a day (BID) | ORAL | Status: DC
  Administered 2023-08-03: 0.5 mg via ORAL
  Filled 2023-08-02 (×3): qty 1

## 2023-08-02 MED ORDER — MELATONIN 3 MG PO TABS
3.0000 mg | ORAL_TABLET | Freq: Every evening | ORAL | Status: DC | PRN
  Administered 2023-08-02: 3 mg via ORAL
  Filled 2023-08-02: qty 1

## 2023-08-02 MED ORDER — MENTHOL 3 MG MT LOZG: 1.0000 | LOZENGE | OROMUCOSAL | Status: DC | PRN

## 2023-08-02 MED ORDER — KETOROLAC TROMETHAMINE 15 MG/ML IJ SOLN
15.0000 mg | Freq: Four times a day (QID) | INTRAMUSCULAR | Status: DC | PRN
  Administered 2023-08-02 – 2023-08-03 (×2): 15 mg via INTRAVENOUS
  Filled 2023-08-02 (×2): qty 1

## 2023-08-02 MED ORDER — PANTOPRAZOLE SODIUM 40 MG PO TBEC
40.0000 mg | DELAYED_RELEASE_TABLET | Freq: Two times a day (BID) | ORAL | Status: DC
  Administered 2023-08-02 – 2023-08-03 (×2): 40 mg via ORAL
  Filled 2023-08-02 (×2): qty 1

## 2023-08-02 MED ORDER — MAGNESIUM CITRATE PO SOLN
1.0000 | Freq: Once | ORAL | Status: DC
  Filled 2023-08-02: qty 296

## 2023-08-02 MED ORDER — PHENOL 1.4 % MT LIQD: 1.0000 | OROMUCOSAL | Status: DC | PRN

## 2023-08-02 NOTE — Progress Notes (Addendum)
in the last 168 hours.   No results found for this or any previous visit (from the past 240 hour(s)).       Radiology Studies: DG Swallowing Func-Speech Pathology  Result Date: 08/02/2023 Table formatting from the original result was not included. Modified Barium Swallow Study Patient Details Name: Debbie Stewart MRN: [id number] Date of Birth: 1996/01/08 Today's Date: 08/02/2023 HPI/PMH: HPI: Pt  admitted with hx of asthma attacks since having Covid 6 months ago. Exacerbated upon admission due to exposure to allergens. Pt was cleaning her bathroom and mixed bleach and vineagar and could sense the chemical exposure.  Pt says "I have a new apartment and OCD, so I had to clean it really well".  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  CT head/neck shows no sign of obstructed airway. Pts breathing is normal while sleeping per staff. Pt evaluated by ENT, note states "Stridor- it does appear her vocal cords are adducting when she is inspiring. She still has adequate airway. She has no lesions and the subglottis looks opens. I think aggressive reflux meds would be first line for this. BID nexium or Protonix." Pt recommended to f/u with Dr Irene Pap after d/c. Clinical Impression: Clinical Impression: Pt presents with normal swallow function and safety per MBSS. Mild deviations observed with piecemeal swallows of small boluses, in which pt spontaneously utilizes second swallow to clear oral cavity. Additional finding of oral holding, in which pt demonstrating small delay in A-P transit of bolus when cued to swallow. Airway protection is adequate with pt demonstrating WNL laryngeal vestibular closure, anterior hyoid excursion, laryngeal elevation, and epiglottic inversion. Throughout study, pt reporting sensation of food sticking (points to medial sternum) and using throat clears to attempt clearance. Throat clears not correlated to airway invasion, as no penetration or aspiration observed. Esophageal sweep with solids boluses (dysphagia 1 and dysphagia 3) reveals complete esophageal clearance. Esophageal sweep with pill administration, given with thin liquid, demonstrates brief statis at base of tongue, clears with subsequent liquid bolus. Pill noted to lodge in upper esophagus. Liquid wash noted to move around pill, not sufficient for pushing through. Puree bolus is  facilitative for moving pill through esophagus. Education provided on using bite of puree with globus PRN. At onset of study, pt presenting with auditory stridor during inhalation. During study, pt frequently demonstrating increased breathing rate, appears anxious. 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 SLP cues for use of nasal inhale with pursed lip vs semi-occluded vocal tract (SOVT) exhale using "sh."  SOVT exhale appears most facilitative for returning breathing rate to baseline of typical rate with stridor present, which allows pt to continue participation in swallow study. Suggest that pt practice breathing strategy when untaxed to aid in ability to utilize when potential vocal cord dysfunction occurs. SLP to f/u to further address implementation of breathing strategies. No further f/u is indicated to address dysphagia at this time. Factors that may increase risk of adverse event in presence of aspiration Rubye Oaks & Clearance Coots 2021): Factors that may increase risk of adverse event in presence of aspiration Rubye Oaks & Clearance Coots 2021): -- (none noted) Recommendations/Plan: Swallowing Evaluation Recommendations Swallowing Evaluation Recommendations Recommendations: PO diet PO Diet Recommendation: Regular; Thin liquids (Level 0) Liquid Administration via: Cup; Straw Medication Administration: Whole meds with liquid (take with liquid, use puree if globus occurs) Supervision: Patient able to self-feed Oral care recommendations: Oral care BID (2x/day) Recommended consults:  PROGRESS NOTE    Classie Weng  ONG:[id number] DOB: [birth year] DOA: 07/31/2023 PCP: [facility]   Chief Complaint  Patient presents with   Asthma   Shortness of Breath    Brief Narrative:  Debbie Stewart is a 27 y.o. female with medical history significant of asthma, anxiety, PNES, menorrhagia with anemia.  Pt with h/o anaphylaxis to shellfish ED visit on 9/25.  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   Assessment & Plan:   Principal Problem:   Asthma exacerbation Active Problems:   Stridor  Vocal cord dysfunction Stridor -Patient remained with significant stridor this morning, I have discussed with ENT, he recommends to continue hospitalization for close observation given her significant stridor, she will be evaluated by Dr. Richardo Hanks lysed laryngologist guarding further recommendations.   -Continue with 40 mg p.o. Protonix twice daily, and Pepcid -Will start on scheduled Xanax, keep on as needed Ativan -Continue with Delora - Steroid has been discontinued  Asthma -Pulmonary input appreciated, patient presentation secondary to stridor not asthma exacerbation, continue with scheduled nebs and Dulera.  Leukocytosis -she is nontoxic-appearing, this is secondary to steroids   DVT prophylaxis: Lovenox Code Status: Full code Family Communication: Discussed with father at bedside Disposition:     Consultants:  Pulmonary ENT  Subjective:  Patient still reports significant stridor, and dyspnea with minimal  activity  Objective: Vitals:   08/02/23 0002 08/02/23 0400 08/02/23 1301 08/02/23 1510  BP: (!) 105/59 110/67 120/73   Pulse:   86   Resp: 18 18 18    Temp: 99.1 F (37.3 C) 98.9 F (37.2 C) 98.6 F (37 C)   TempSrc: Oral Oral Oral   SpO2: 100%   100%  Weight:      Height:        Intake/Output Summary (Last 24 hours) at 08/02/2023 1547 Last data filed at 08/02/2023 4401 Gross per 24 hour  Intake 92.52 ml  Output --  Net 92.52 ml   Filed Weights   07/31/23 2212  Weight: 70.3 kg    Examination:  Awake Alert, Oriented X 3, No new F.N deficits, she is anxious Symmetrical Chest wall movement, patient with significant stridors RRR,No Gallops,Rubs or new Murmurs, No Parasternal Heave +ve B.Sounds, Abd Soft, No tenderness, No rebound - guarding or rigidity. No Cyanosis, Clubbing or edema, No new Rash or bruise     Data Reviewed: I have personally reviewed following labs and imaging studies  CBC: Recent Labs  Lab 07/31/23 2225 08/02/23 0327  WBC 8.8 22.7*  HGB 9.4* 9.2*  HCT 30.9* 29.7*  MCV 81.3 81.4  PLT 411* 424*    Basic Metabolic Panel: Recent Labs  Lab 07/31/23 2225 08/02/23 0327  NA 140 137  K 3.8 3.7  CL 104 106  CO2 24 22  GLUCOSE 95 88  BUN 10 8  CREATININE 0.68 0.71  CALCIUM 9.1 9.2    GFR: Estimated Creatinine Clearance: 98 mL/min (by C-G formula based on SCr of 0.71 mg/dL).  Liver Function Tests: No results for input(s): "AST", "ALT", "ALKPHOS", "BILITOT", "PROT", "ALBUMIN" in the last 168 hours.  CBG: No results for input(s): "GLUCAP"  PROGRESS NOTE    Classie Weng  ONG:[id number] DOB: [birth year] DOA: 07/31/2023 PCP: [facility]   Chief Complaint  Patient presents with   Asthma   Shortness of Breath    Brief Narrative:  Debbie Stewart is a 27 y.o. female with medical history significant of asthma, anxiety, PNES, menorrhagia with anemia.  Pt with h/o anaphylaxis to shellfish ED visit on 9/25.  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   Assessment & Plan:   Principal Problem:   Asthma exacerbation Active Problems:   Stridor  Vocal cord dysfunction Stridor -Patient remained with significant stridor this morning, I have discussed with ENT, he recommends to continue hospitalization for close observation given her significant stridor, she will be evaluated by Dr. Richardo Hanks lysed laryngologist guarding further recommendations.   -Continue with 40 mg p.o. Protonix twice daily, and Pepcid -Will start on scheduled Xanax, keep on as needed Ativan -Continue with Delora - Steroid has been discontinued  Asthma -Pulmonary input appreciated, patient presentation secondary to stridor not asthma exacerbation, continue with scheduled nebs and Dulera.  Leukocytosis -she is nontoxic-appearing, this is secondary to steroids   DVT prophylaxis: Lovenox Code Status: Full code Family Communication: Discussed with father at bedside Disposition:     Consultants:  Pulmonary ENT  Subjective:  Patient still reports significant stridor, and dyspnea with minimal  activity  Objective: Vitals:   08/02/23 0002 08/02/23 0400 08/02/23 1301 08/02/23 1510  BP: (!) 105/59 110/67 120/73   Pulse:   86   Resp: 18 18 18    Temp: 99.1 F (37.3 C) 98.9 F (37.2 C) 98.6 F (37 C)   TempSrc: Oral Oral Oral   SpO2: 100%   100%  Weight:      Height:        Intake/Output Summary (Last 24 hours) at 08/02/2023 1547 Last data filed at 08/02/2023 4401 Gross per 24 hour  Intake 92.52 ml  Output --  Net 92.52 ml   Filed Weights   07/31/23 2212  Weight: 70.3 kg    Examination:  Awake Alert, Oriented X 3, No new F.N deficits, she is anxious Symmetrical Chest wall movement, patient with significant stridors RRR,No Gallops,Rubs or new Murmurs, No Parasternal Heave +ve B.Sounds, Abd Soft, No tenderness, No rebound - guarding or rigidity. No Cyanosis, Clubbing or edema, No new Rash or bruise     Data Reviewed: I have personally reviewed following labs and imaging studies  CBC: Recent Labs  Lab 07/31/23 2225 08/02/23 0327  WBC 8.8 22.7*  HGB 9.4* 9.2*  HCT 30.9* 29.7*  MCV 81.3 81.4  PLT 411* 424*    Basic Metabolic Panel: Recent Labs  Lab 07/31/23 2225 08/02/23 0327  NA 140 137  K 3.8 3.7  CL 104 106  CO2 24 22  GLUCOSE 95 88  BUN 10 8  CREATININE 0.68 0.71  CALCIUM 9.1 9.2    GFR: Estimated Creatinine Clearance: 98 mL/min (by C-G formula based on SCr of 0.71 mg/dL).  Liver Function Tests: No results for input(s): "AST", "ALT", "ALKPHOS", "BILITOT", "PROT", "ALBUMIN" in the last 168 hours.  CBG: No results for input(s): "GLUCAP"  PROGRESS NOTE    Classie Weng  ONG:[id number] DOB: [birth year] DOA: 07/31/2023 PCP: [facility]   Chief Complaint  Patient presents with   Asthma   Shortness of Breath    Brief Narrative:  Debbie Stewart is a 27 y.o. female with medical history significant of asthma, anxiety, PNES, menorrhagia with anemia.  Pt with h/o anaphylaxis to shellfish ED visit on 9/25.  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   Assessment & Plan:   Principal Problem:   Asthma exacerbation Active Problems:   Stridor  Vocal cord dysfunction Stridor -Patient remained with significant stridor this morning, I have discussed with ENT, he recommends to continue hospitalization for close observation given her significant stridor, she will be evaluated by Dr. Richardo Hanks lysed laryngologist guarding further recommendations.   -Continue with 40 mg p.o. Protonix twice daily, and Pepcid -Will start on scheduled Xanax, keep on as needed Ativan -Continue with Delora - Steroid has been discontinued  Asthma -Pulmonary input appreciated, patient presentation secondary to stridor not asthma exacerbation, continue with scheduled nebs and Dulera.  Leukocytosis -she is nontoxic-appearing, this is secondary to steroids   DVT prophylaxis: Lovenox Code Status: Full code Family Communication: Discussed with father at bedside Disposition:     Consultants:  Pulmonary ENT  Subjective:  Patient still reports significant stridor, and dyspnea with minimal  activity  Objective: Vitals:   08/02/23 0002 08/02/23 0400 08/02/23 1301 08/02/23 1510  BP: (!) 105/59 110/67 120/73   Pulse:   86   Resp: 18 18 18    Temp: 99.1 F (37.3 C) 98.9 F (37.2 C) 98.6 F (37 C)   TempSrc: Oral Oral Oral   SpO2: 100%   100%  Weight:      Height:        Intake/Output Summary (Last 24 hours) at 08/02/2023 1547 Last data filed at 08/02/2023 4401 Gross per 24 hour  Intake 92.52 ml  Output --  Net 92.52 ml   Filed Weights   07/31/23 2212  Weight: 70.3 kg    Examination:  Awake Alert, Oriented X 3, No new F.N deficits, she is anxious Symmetrical Chest wall movement, patient with significant stridors RRR,No Gallops,Rubs or new Murmurs, No Parasternal Heave +ve B.Sounds, Abd Soft, No tenderness, No rebound - guarding or rigidity. No Cyanosis, Clubbing or edema, No new Rash or bruise     Data Reviewed: I have personally reviewed following labs and imaging studies  CBC: Recent Labs  Lab 07/31/23 2225 08/02/23 0327  WBC 8.8 22.7*  HGB 9.4* 9.2*  HCT 30.9* 29.7*  MCV 81.3 81.4  PLT 411* 424*    Basic Metabolic Panel: Recent Labs  Lab 07/31/23 2225 08/02/23 0327  NA 140 137  K 3.8 3.7  CL 104 106  CO2 24 22  GLUCOSE 95 88  BUN 10 8  CREATININE 0.68 0.71  CALCIUM 9.1 9.2    GFR: Estimated Creatinine Clearance: 98 mL/min (by C-G formula based on SCr of 0.71 mg/dL).  Liver Function Tests: No results for input(s): "AST", "ALT", "ALKPHOS", "BILITOT", "PROT", "ALBUMIN" in the last 168 hours.  CBG: No results for input(s): "GLUCAP"

## 2023-08-02 NOTE — Progress Notes (Signed)
SATURATION QUALIFICATIONS: (This note is used to comply with regulatory documentation for home oxygen)  Patient Saturations on Room Air at Rest = 100%  Patient Saturations on Room Air while Ambulating = 100%    Pt did not required supplemental oxygen   Jerolyn Center, PT Acute Rehabilitation Services  Office [phone]

## 2023-08-02 NOTE — Progress Notes (Signed)
Physical Therapy Treatment Patient Details Name: Debbie Stewart MRN: [id number] DOB: Jun 26, 1996 Today's Date: 08/02/2023   History of Present Illness Pt is a 27 yo female admitted wtih SOB, wheezing and in respirtatory distress. +vocal cord dysfunction  PHM: anaphylactic reaction early Sept, anxiety and asthma    PT Comments  Patient eager to ambulate in hallway. Tolerated on RA with sats 100%, HR max 110 however reported lightheadedness. On return to room BP 122/65 sitting. Continue PT working towards goals.     If plan is discharge home, recommend the following:     Can travel by private vehicle        Equipment Recommendations  None recommended by PT    Recommendations for Other Services       Precautions / Restrictions Precautions Precautions: Other (comment) Precaution Comments: watch SpO2 and HR Restrictions Weight Bearing Restrictions: No     Mobility  Bed Mobility Overal bed mobility: Modified Independent Bed Mobility: Supine to Sit     Supine to sit: Modified independent (Device/Increase time)          Transfers Overall transfer level: Independent Equipment used: None Transfers: Sit to/from Stand Sit to Stand: Independent           General transfer comment: HR initially incr 128 with standing; pt returned to sitting and max HR with ambulation 110    Ambulation/Gait Ambulation/Gait assistance: Contact guard assist Gait Distance (Feet): 100 Feet Assistive device: 1 person hand held assist, None Gait Pattern/deviations: Step-through pattern, Decreased stride length, Wide base of support   Gait velocity interpretation: <1.8 ft/sec, indicate of risk for recurrent falls   General Gait Details: standing rest break x 2 due to stridor/dyspnea; began to feel lightheaded and used Light HHA last 50 ft   Stairs             Wheelchair Mobility     Tilt Bed    Modified Rankin (Stroke Patients Only)       Balance Overall balance assessment:  No apparent balance deficits (not formally assessed)                                          Cognition Arousal: Alert Behavior During Therapy: Anxious Overall Cognitive Status: Within Functional Limits for tasks assessed (anxious, frequently apologetic, distractible)                                 General Comments: independent PTA working 3 jobs        Exercises      General Comments General comments (skin integrity, edema, etc.): sats 100% on RA throughout; HR max 110 while walking; BP on return to room 122/65      Pertinent Vitals/Pain Pain Assessment Pain Assessment: Faces Faces Pain Scale: No hurt    Home Living                          Prior Function            PT Goals (current goals can now be found in the care plan section) Acute Rehab PT Goals Patient Stated Goal: breathe better Time For Goal Achievement: 08/15/23 Potential to Achieve Goals: Good Progress towards PT goals: Progressing toward goals    Frequency    Min 1X/week  PT Plan      Co-evaluation              AM-PAC PT "6 Clicks" Mobility   Outcome Measure  Help needed turning from your back to your side while in a flat bed without using bedrails?: None Help needed moving from lying on your back to sitting on the side of a flat bed without using bedrails?: None Help needed moving to and from a bed to a chair (including a wheelchair)?: None Help needed standing up from a chair using your arms (e.g., wheelchair or bedside chair)?: None Help needed to walk in hospital room?: A Little Help needed climbing 3-5 steps with a railing? : Total 6 Click Score: 20    End of Session   Activity Tolerance: Treatment limited secondary to medical complications (Comment) (DOE) Patient left: with call bell/phone within reach;in chair Nurse Communication: Mobility status;Other (comment) (oxygen sats; felt lightheaded with BP stable) PT Visit Diagnosis:  Other abnormalities of gait and mobility (R26.89)     Time: 9147-8295 PT Time Calculation (min) (ACUTE ONLY): 18 min  Charges:    $Gait Training: 8-22 mins PT General Charges $$ ACUTE PT VISIT: 1 Visit                      Jerolyn Center, PT Acute Rehabilitation Services  Office [phone]    [name] 08/02/2023, 3:39 PM

## 2023-08-02 NOTE — Progress Notes (Signed)
PT Cancellation Note  Patient Details Name: Debbie Stewart MRN: [id number] DOB: 19-Feb-1996   Cancelled Treatment:    Reason Eval/Treat Not Completed: Patient at procedure or test/unavailable  Pt preparing to get into shower with nursing assist. Will see later today   Jerolyn Center, PT Acute Rehabilitation Services  Office [phone]  [name] 08/02/2023, 10:45 AM
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
## 2023-08-02 NOTE — Progress Notes (Signed)
Modified Barium Swallow Study  Patient Details  Name: Debbie Stewart MRN: [id number] Date of Birth: Feb 23, 1996  Today's Date: 08/02/2023  Modified Barium Swallow completed.  Full report located under Chart Review in the Imaging Section.  History of Present Illness Pt admitted with hx of asthma attacks since having Covid 6 months ago. Exacerbated upon admission due to exposure to allergens. Pt was cleaning her bathroom and mixed bleach and vineagar and could sense the chemical exposure.  Pt says "I have a new apartment and OCD, so I had to clean it really well".  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  CT head/neck shows no sign of obstructed airway. Pts breathing is normal while sleeping per staff. Pt evaluated by ENT, note states "Stridor- it does appear her vocal cords are adducting when she is inspiring. She still has adequate airway. She has no lesions and the subglottis looks opens. I think aggressive reflux meds would be first line for this. BID nexium or Protonix." Pt recommended to f/u with Dr Irene Pap after d/c.   Clinical Impression Pt presents with normal swallow function and safety per MBSS. Mild deviations observed with piecemeal swallows of small boluses, in which pt spontaneously utilizes second swallow to clear oral cavity. Additional finding of oral holding, in which pt demonstrating small delay in A-P transit of bolus when cued to swallow. Airway protection is adequate with pt demonstrating WNL laryngeal vestibular closure, anterior hyoid excursion, laryngeal elevation, and epiglottic inversion. Throughout study, pt reporting sensation of food sticking (points to medial sternum) and using throat clears to attempt clearance. Throat clears not correlated to airway invasion, as no penetration or aspiration observed. Esophageal sweep with solids boluses (dysphagia 1 and dysphagia 3) reveals complete esophageal clearance. Esophageal sweep with  pill administration, given with thin liquid, demonstrates brief statis at base of tongue, clears with subsequent liquid bolus. Pill noted to lodge in upper esophagus. Liquid wash noted to move around pill, not sufficient for pushing through. Puree bolus is facilitative for moving pill through esophagus. Education provided on using bite of puree with globus PRN.   At onset of study, pt presenting with auditory stridor during inhalation. During study, pt frequently demonstrating increased breathing rate, appears anxious. 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 SLP cues for use of nasal inhale with pursed lip vs semi-occluded vocal tract (SOVT) exhale using "sh."  SOVT exhale appears most facilitative for returning breathing rate to baseline of typical rate with stridor present, which allows pt to continue participation in swallow study. Suggest that pt practice breathing strategy when untaxed to aid in ability to utilize when potential vocal cord dysfunction occurs. SLP to f/u to further address implementation of breathing strategies. No further f/u is indicated to address dysphagia at this time.  Factors that may increase risk of adverse event in presence of aspiration Rubye Oaks & Clearance Coots 2021):  (none noted)  Swallow Evaluation Recommendations Recommendations: PO diet PO Diet Recommendation: Regular;Thin liquids (Level 0) Liquid Administration via: Cup;Straw Medication Administration: Whole meds with liquid (take with liquid, use puree if globus occurs) Supervision: Patient able to self-feed Oral care recommendations: Oral care BID (2x/day) Recommended consults: Consider ENT consultation;Consider esophageal assessment      Maia Breslow 08/02/2023,2:11 PM

## 2023-08-02 NOTE — Progress Notes (Signed)
Patient ID: Debbie Stewart, female   DOB: 07-01-1996, 27 y.o.   MRN: [id number]  She is still having stridor and anxiety. Speech therapy is there. She is very nervous.  I have talked to Dr Irene Pap and she will evaluate the patient.

## 2023-08-02 NOTE — Progress Notes (Signed)
TRH night cross cover note:   I was notified by RN of the patient's request for a sleep aid. I subsequently placed order for prn melatonin for insomnia.     Keelan Tripodi, DO Hospitalist  

## 2023-08-02 NOTE — Evaluation (Signed)
Speech Language Pathology Evaluation Patient Details Name: Debbie Stewart MRN: [id number] DOB: [birth year] Today's Date: 08/02/2023 Time: 4098-1191 SLP Time Calculation (min) (ACUTE ONLY): 35 min  Problem List:  Patient Active Problem List   Diagnosis Date Noted   Asthma exacerbation 08/01/2023   Stridor 08/01/2023   Past Medical History:  Past Medical History:  Diagnosis Date   Anemia    Anxiety    Asthma    Depression    Fibroid    Hyperthyroidism    Ovarian cyst    Seizures (HCC)    psuedo seizures,  stress related   Past Surgical History:  Past Surgical History:  Procedure Laterality Date   DILATION AND CURETTAGE OF UTERUS     WISDOM TOOTH EXTRACTION     HPI:  Pt admitted with hx of asthma attacks since having Covid 6 months ago. Exacerbated upon admission due to exposure to allergens. Pt was cleaning her bathroom and mixed bleach and vineagar and could sense the chemical exposure.  Pt says "I have a new apartment and OCD, so I had to clean it really well".  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  CT head/neck shows no sign of obstructed airway. Pts breathing is normal while sleeping per staff. Pt evaluated by ENT, note states "Stridor- it does appear her vocal cords are adducting when she is inspiring. She still has adequate airway. She has no lesions and the subglottis looks opens. I think aggressive reflux meds would be first line for this. BID nexium or Protonix." Pt recommended to f/u with Dr Debbie Stewart after d/c.   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 Direct visualization of this was confirmed by ENT yesterday, who was also present during SLP visit. Pt observed to have inspiratory stridor of varying degress of severity during SLP visit. She often gives  herself a puff of her inhaler, which does not appear to have any effect. Pts breathing became more severe as she gave her report. She was very concerned about exposure of toxins while cleaning her bathroom. SLP sat with pt and showed her images and videos of the vocal folds and how the airway functions during normal breathing. We talked about how she seems to be closing her vocal folds on an inhale, instead of leaving them relaxed and open. We talked about how this is a common problem in people with asthma or relux for example, but it can be controlled by her concsious brain. Helping her understand and visualize her airway can improve the control she can has over her muscles. SLP demonstrated a sniff to force abduction with a pursed lip exhale. Pt was responsive to the strategy but a little too aggressive in trying it repeatedly. We talked about positive signs of good breathing (during speech, when exhaling, when calm, when sleeping) that shows [place] that her airway is open. Pt will need further f/u for breathing strategies. Anxiety is currently exacerbating her maladaptive breathing pattern making it difficult to put into practice.    SLP Assessment  SLP Recommendation/Assessment: Patient needs continued Speech Lanaguage Pathology Services SLP Visit Diagnosis: Other (comment);Aphonia (R49.1) (dysphonia)    Recommendations for follow up therapy are one component of a multi-disciplinary discharge planning process, led by the attending physician.  Recommendations may be updated based on patient status, additional functional criteria and insurance authorization.  Follow Up Recommendations  Outpatient SLP    Assistance Recommended at Discharge  Set up Supervision/Assistance  Functional Status Assessment Patient has had a recent decline in their functional status and demonstrates the ability to make significant improvements in function in a reasonable and predictable amount of time.  Frequency and Duration  min 2x/week  2 weeks      SLP Evaluation Cognition  Overall Cognitive Status:  (anxious, frequently apologetic, distractible)       Comprehension  Auditory Comprehension Overall Auditory Comprehension: Appears within functional limits for tasks assessed    Expression Verbal Expression Overall Verbal Expression: Appears within functional limits for tasks assessed   Oral / Motor  Oral Motor/Sensory Function Overall Oral Motor/Sensory Function: Within functional limits Motor Speech Overall Motor Speech: Impaired Phonation: Breathy;Hoarse            Debbie Stewart, Debbie Stewart 08/02/2023, 11:57 AM

## 2023-08-02 NOTE — Progress Notes (Signed)
Initial Nutrition Assessment  DOCUMENTATION CODES:   Not applicable  INTERVENTION:   Snack in evening  NUTRITION DIAGNOSIS:   Increased nutrient needs related to chronic illness as evidenced by estimated needs.    GOAL:   Patient will meet greater than or equal to 90% of their needs    MONITOR:   PO intake, Labs, Weight trends  REASON FOR ASSESSMENT:   Consult Assessment of nutrition requirement/status  ASSESSMENT:   27 y.o. female with medical history significant of asthma, anxiety, PNES, menorrhagia with anemia,  h/o anaphylaxis to shellfish ED visit on 9/25.  Admitted for Asthma exacerbation. Patient friendly at time of visit. Reports no appetite changes. Shellfish allergy. Discussed snacks in evening and pt was agreeable.    Admit weight: 70.3 kg Current weight: 70.3 kg  Weight history: 07/31/23 70.3 kg  02/24/23 74.4 kg  11/07/22 72.6 kg  08/20/22 72.6 kg    Average Meal Intake: No current documentation noted.    Nutritionally Relevant Medications: Scheduled Meds:  albuterol  10 mg/hr Nebulization Once   enoxaparin (LOVENOX) injection  40 mg Subcutaneous Q24H   methylPREDNISolone (SOLU-MEDROL) injection  80 mg Intravenous Q24H    PRN Meds:.albuterol, LORazepam, melatonin, oxymetazoline  Labs Reviewed   NUTRITION - FOCUSED PHYSICAL EXAM:  Flowsheet Row Most Recent Value  Orbital Region Mild depletion  Upper Arm Region Mild depletion  Thoracic and Lumbar Region Mild depletion  Buccal Region Mild depletion  Temple Region Mild depletion  Clavicle Bone Region Mild depletion  Clavicle and Acromion Bone Region Mild depletion  Scapular Bone Region No depletion  Dorsal Hand No depletion  Patellar Region No depletion  Posterior Calf Region No depletion  Edema (RD Assessment) None  Hair Reviewed  Eyes Reviewed  Mouth Reviewed  Skin Reviewed  Nails Reviewed       Diet Order:   Diet Order             Diet Heart Room service appropriate?  Yes; Fluid consistency: Thin  Diet effective now                   EDUCATION NEEDS:   No education needs have been identified at this time  Skin:  Skin Assessment: Reviewed RN Assessment  Last BM:  PTA  Height:   Ht Readings from Last 1 Encounters:  07/31/23 5\' 6"  (1.676 m)    Weight:   Wt Readings from Last 1 Encounters:  07/31/23 70.3 kg    Ideal Body Weight:  59 kg  BMI:  Body mass index is 25.02 kg/m.  Estimated Nutritional Needs:   Kcal:  1700-2100 kcal  Protein:  70-92 g  Fluid:  1700-2100 ml    Jamelle Haring RDN, LDN Clinical Dietitian  RDN pager # available on Amion

## 2023-08-02 NOTE — TOC Progression Note (Signed)
Transition of Care [facility]) - Progression Note    Patient Details  Name: Debbie Stewart MRN: [id number] Date of Birth: Oct 16, 1996  Transition of [facility]) CM/SW Contact  Gordy Clement, RN Phone Number: 08/02/2023, 12:10 PM  Clinical Narrative:     CM met with patient at bedside to discuss DME needed and the copay involved. $179.54 is patient's deductible. Patient stated she gets paid next week and is asking if DME company can bill her. CM has reached out to DME company and they have agreed to bill the patient and will get information from patient to do so. The nebulizer will be delivered bedside .  Patient states that she has family that will transport her home at [place].   TOC will continue to follow patient for any additional discharge needs               Expected Discharge Plan and Services                                               Social Determinants of Health (SDOH) Interventions SDOH Screenings   Food Insecurity: No Food Insecurity (08/01/2023)  Housing: Low Risk  (08/01/2023)  Transportation Needs: No Transportation Needs (08/01/2023)  Utilities: Not At Risk (08/01/2023)  Tobacco Use: Low Risk  (07/31/2023)    Readmission Risk Interventions     No data to display

## 2023-08-02 NOTE — Evaluation (Signed)
Clinical/Bedside Swallow Evaluation Patient Details  Name: Zyanna Vanloo MRN: [id number] Date of Birth: [birth year]  Today's Date: 08/02/2023 Time: SLP Start Time (ACUTE ONLY): 1045 SLP Stop Time (ACUTE ONLY): 1120 SLP Time Calculation (min) (ACUTE ONLY): 35 min  Past Medical History:  Past Medical History:  Diagnosis Date   Anemia    Anxiety    Asthma    Depression    Fibroid    Hyperthyroidism    Ovarian cyst    Seizures (HCC)    psuedo seizures,  stress related   Past Surgical History:  Past Surgical History:  Procedure Laterality Date   DILATION AND CURETTAGE OF UTERUS     WISDOM TOOTH EXTRACTION     HPI:  Pt admitted with hx of asthma attacks since having Covid 6 months ago. Exacerbated upon admission due to exposure to allergens.  She has had many attacks and treated with meds. 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 She usually has some chest discomfort with these episodes of asthma. SHe also has a weak voice. She has varying amounts of stridor with the episodes. Pt evaluated by ENT, note states "Stridor- it does appear her vocal cords are adducting when she is inspiring. She still has adequate airway. She has no lesions and the subglottis looks opens. I think aggressive reflux meds would be first line for this. BID nexium or Protonix." Pt recommended to f/u with Dr Irene Pap after d/c.    Assessment / Plan / Recommendation  Clinical Impression  Pt observed to have impaired breathing pattern, exacerbated when swallowing. SLP entered the room and pt was breathing calmly and sipping from an ensure without difficulty. Upon thinking and talking about her concerns her striderous breathing became significant. She confirmed difficulty swallowing when asked and reported a feeling of tension mid sternum that was similar to the feeling she has during exposure to shellfish. When directed to take a sip pt had immediate deep inspiration after the  swallow with stridor, which is a dangerous behavior for swallowing; the swallow typically occurs during exhalation for further airway protection. Did not ask her to consume more PO under observation. Will complete MBS while admitted for instrumental assessment of swallowing for ENT to review as well in any f/u. SLP Visit Diagnosis: Dysphagia, unspecified (R13.10)    Aspiration Risk  Mild aspiration risk    Diet Recommendation Regular;Thin liquid    Liquid Administration via: Cup;Straw Medication Administration: Whole meds with liquid Supervision: Patient able to self feed Compensations: Minimize environmental distractions    Other  Recommendations      Recommendations for follow up therapy are one component of a multi-disciplinary discharge planning process, led by the attending physician.  Recommendations may be updated based on patient status, additional functional criteria and insurance authorization.  Follow up Recommendations        Assistance Recommended at Discharge    Functional Status Assessment    Frequency and Duration            Prognosis Prognosis for improved oropharyngeal function: Good      Swallow Study   General HPI: Pt admitted with hx of asthma attacks since having Covid 6 months ago. Exacerbated upon admission due to exposure to allergens.  She has had many attacks and treated with meds. 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 She usually has some chest discomfort with these episodes of asthma. SHe also has a  weak voice. She has varying amounts of stridor with the episodes. Pt evaluated by ENT, note states "Stridor- it does appear her vocal cords are adducting when she is inspiring. She still has adequate airway. She has no lesions and the subglottis looks opens. I think aggressive reflux meds would be first line for this. BID nexium or Protonix." Pt recommended to f/u with Dr Irene Pap after d/c. Type of Study:  Bedside Swallow Evaluation Previous Swallow Assessment: none Diet Prior to this Study: Regular;Thin liquids (Level 0) Temperature Spikes Noted: No Respiratory Status: Room air History of Recent Intubation: No Behavior/Cognition: Alert;Cooperative;Pleasant mood Oral Cavity Assessment: Within Functional Limits Oral Care Completed by SLP: No Oral Cavity - Dentition: Adequate natural dentition Vision: Functional for self-feeding Self-Feeding Abilities: Able to feed self Patient Positioning: Upright in bed Baseline Vocal Quality: Hoarse;Breathy Volitional Cough: Strong Volitional Swallow: Able to elicit    Oral/Motor/Sensory Function Overall Oral Motor/Sensory Function: Within functional limits   Ice Chips     Thin Liquid Thin Liquid: Impaired Presentation: Cup;Self Fed Pharyngeal  Phase Impairments: Other (comments) (change in breathing pattern, globus)    Nectar Thick Nectar Thick Liquid: Not tested   Honey Thick Honey Thick Liquid: Not tested   Puree Puree: Not tested   Solid     Solid: Not tested      Jerimy Johanson, Riley Nearing 08/02/2023,11:33 AM

## 2023-08-02 NOTE — Progress Notes (Addendum)
TRH night cross cover note:   I was notified by RN of the patient's complaint of sore throat.  I subsequently placed orders for prn Cepacol throat lozenges as well as prn Chloraseptic throat spray now for her.   Update: The patient conveys that the sore throat is better described as chest pain radiating into her right shoulder.  RN conveys that the patient is anxious, and has just received a dose of Ativan.  I subsequently placed orders for chest x-ray, EKG, as well as prn IV Toradol.    Update: I evaluated the patient at bedside.  She appeared comfortable, and in no acute distress, but conveys that she continues to experience left-sided chest discomfort, radiating into the left shoulder, which she notes worsens with deep inspiration.  She states that the chest discomfort is nonpositional, and is unchanged with movements of the left shoulder/left upper extremity.  Not associated with any additional symptoms.  I suspect that this discomfort is likely musculoskeletal due to muscular strain from her initially increased work of breathing at the time of admission.  But with ongoing pleuritic chest discomfort, I am also going to add on D-dimer to evaluate for PE.    Newton Pigg, DO Hospitalist

## 2023-08-02 NOTE — Discharge Instructions (Signed)
Follow with Primary MD [facility] in 7 days   Get CBC, CMP,   by Primary MD next visit.     Disposition Home    Diet: Regular Diet   On your next visit with your primary care physician please Get Medicines reviewed and adjusted.   Please request your Prim.MD to go over all Hospital Tests and Procedure/Radiological results at the follow up, please get all Hospital records sent to your Prim MD by signing hospital release before you go home.   If you experience worsening of your admission symptoms, develop shortness of breath, life threatening emergency, suicidal or homicidal thoughts you must seek medical attention immediately by calling 911 or calling your MD immediately  if symptoms less severe.  You Must read complete instructions/literature along with all the possible adverse reactions/side effects for all the Medicines you take and that have been prescribed to you. Take any new Medicines after you have completely understood and accpet all the possible adverse reactions/side effects.   Do not drive, operating heavy machinery, perform activities at heights, swimming or participation in water activities or provide baby sitting services if your were admitted for syncope or siezures until you have seen by Primary MD or a Neurologist and advised to do so again.  Do not drive when taking Pain medications.    Do not take more than prescribed Pain, Sleep and Anxiety Medications  Special Instructions: If you have smoked or chewed Tobacco  in the last 2 yrs please stop smoking, stop any regular Alcohol  and or any Recreational drug use.  Wear Seat belts while driving.   Please note  You were cared for by a hospitalist during your hospital stay. If you have any questions about your discharge medications or the care you received while you were in the hospital after you are discharged, you can call the unit and asked to speak with the hospitalist on call if the  hospitalist that took care of you is not available. Once you are discharged, your primary care physician will handle any further medical issues. Please note that NO REFILLS for any discharge medications will be authorized once you are discharged, as it is imperative that you return to your primary care physician (or establish a relationship with a primary care physician if you do not have one) for your aftercare needs so that they can reassess your need for medications and monitor your lab values.
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
## 2023-08-03 ENCOUNTER — Other Ambulatory Visit (INDEPENDENT_AMBULATORY_CARE_PROVIDER_SITE_OTHER): Payer: Self-pay | Admitting: Otolaryngology

## 2023-08-03 ENCOUNTER — Other Ambulatory Visit (HOSPITAL_COMMUNITY): Payer: Self-pay

## 2023-08-03 DIAGNOSIS — K219 Gastro-esophageal reflux disease without esophagitis: Secondary | ICD-10-CM

## 2023-08-03 DIAGNOSIS — J383 Other diseases of vocal cords: Secondary | ICD-10-CM | POA: Diagnosis not present

## 2023-08-03 DIAGNOSIS — J45901 Unspecified asthma with (acute) exacerbation: Secondary | ICD-10-CM | POA: Diagnosis not present

## 2023-08-03 DIAGNOSIS — R0789 Other chest pain: Secondary | ICD-10-CM | POA: Diagnosis not present

## 2023-08-03 DIAGNOSIS — R0600 Dyspnea, unspecified: Secondary | ICD-10-CM | POA: Diagnosis not present

## 2023-08-03 DIAGNOSIS — R061 Stridor: Secondary | ICD-10-CM | POA: Diagnosis not present

## 2023-08-03 LAB — BASIC METABOLIC PANEL
Anion gap: 7 (ref 5–15)
BUN: 14 mg/dL (ref 6–20)
CO2: 24 mmol/L (ref 22–32)
Calcium: 9.3 mg/dL (ref 8.9–10.3)
Chloride: 105 mmol/L (ref 98–111)
Creatinine, Ser: 0.76 mg/dL (ref 0.44–1.00)
GFR, Estimated: >60 mL/min (ref >60)
Glucose, Bld: 104 mg/dL — ABNORMAL HIGH (ref 70–99)
Potassium: 3.9 mmol/L (ref 3.5–5.1)
Sodium: 136 mmol/L (ref 135–145)

## 2023-08-03 LAB — CBC
HCT: 30.7 % — ABNORMAL LOW (ref 36.0–46.0)
Hemoglobin: 9.5 g/dL — ABNORMAL LOW (ref 12.0–15.0)
MCH: 25.6 pg — ABNORMAL LOW (ref 26.0–34.0)
MCHC: 30.9 g/dL (ref 30.0–36.0)
MCV: 82.7 fL (ref 80.0–100.0)
Platelets: 414 10*3/uL — ABNORMAL HIGH (ref 150–400)
RBC: 3.71 MIL/uL — ABNORMAL LOW (ref 3.87–5.11)
RDW: 16.9 % — ABNORMAL HIGH (ref 11.5–15.5)
WBC: 17.5 10*3/uL — ABNORMAL HIGH (ref 4.0–10.5)
nRBC: 0 % (ref 0.0–0.2)

## 2023-08-03 LAB — D-DIMER, QUANTITATIVE: D-Dimer, Quant: 0.28 ug{FEU}/mL (ref 0.00–0.50)

## 2023-08-03 MED ORDER — FAMOTIDINE 20 MG PO TABS: 20.0000 mg | ORAL_TABLET | Freq: Two times a day (BID) | ORAL | Status: DC

## 2023-08-03 MED ORDER — ALBUTEROL SULFATE (2.5 MG/3ML) 0.083% IN NEBU
2.5000 mg | INHALATION_SOLUTION | RESPIRATORY_TRACT | 0 refills | Status: DC | PRN
  Filled 2023-08-03: qty 90, 3d supply, fill #0

## 2023-08-03 MED ORDER — MOMETASONE FURO-FORMOTEROL FUM 200-5 MCG/ACT IN AERO
2.0000 | INHALATION_SPRAY | Freq: Two times a day (BID) | RESPIRATORY_TRACT | 0 refills | Status: DC
  Filled 2023-08-03: qty 13, 30d supply, fill #0

## 2023-08-03 MED ORDER — PANTOPRAZOLE SODIUM 40 MG PO TBEC
40.0000 mg | DELAYED_RELEASE_TABLET | Freq: Two times a day (BID) | ORAL | 0 refills | Status: AC
Start: 2023-08-03 — End: ?
  Filled 2023-08-03: qty 60, 30d supply, fill #0

## 2023-08-03 MED ORDER — ALPRAZOLAM 0.5 MG PO TABS
0.5000 mg | ORAL_TABLET | Freq: Two times a day (BID) | ORAL | 0 refills | Status: AC
  Filled 2023-08-03: qty 30, 15d supply, fill #0

## 2023-08-03 MED ORDER — THIAMINE HCL 100 MG/ML IJ SOLN
100.0000 mg | Freq: Once | INTRAMUSCULAR | Status: AC
  Administered 2023-08-03: 100 mg via INTRAVENOUS
  Filled 2023-08-03: qty 2

## 2023-08-03 MED ORDER — SODIUM CHLORIDE 0.9 % IV BOLUS
500.0000 mL | Freq: Once | INTRAVENOUS | Status: AC
  Administered 2023-08-03: 500 mL via INTRAVENOUS

## 2023-08-03 NOTE — Progress Notes (Signed)
PT Cancellation Note  Patient Details Name: Debbie Stewart MRN: [id number] DOB: 11-16-1995   Cancelled Treatment:    Reason Eval/Treat Not Completed: Fatigue/lethargy limiting ability to participate (Patient reports recently ambulating in hallway and feels too fatigued for mobility.  PT will continue to follow.)  Donna Bernard, PT, MPT  Debbie Stewart 08/03/2023, 12:53 PM

## 2023-08-03 NOTE — Progress Notes (Signed)
Needs speech therapy for VCD/PVFMD - order placed

## 2023-08-03 NOTE — Progress Notes (Signed)
Mobility Specialist Progress Note;   08/03/23 1145  Mobility  Activity Ambulated with assistance in hallway  Level of Assistance Modified independent, requires aide device or extra time  Assistive Device Other (Comment) (HHA)  Distance Ambulated (ft) 250 ft  Activity Response Tolerated fair  Mobility Referral Yes  $Mobility charge 1 Mobility  Mobility Specialist Start Time (ACUTE ONLY) 1145  Mobility Specialist Stop Time (ACUTE ONLY) 1155  Mobility Specialist Time Calculation (min) (ACUTE ONLY) 10 min    Pre-mobility: SPO2 100% RA During-mobility: SPO2 96-99% RA Post-mobility: SPO2 100% RA  Pt agreeable to mobility. Required HHA for ambulation. Pt displayed SOB and wheezing throughout ambulation and post ambulation. C/o dizziness and stated she was seeing "black dots" in her vision, however VSS on RA. Pt left in bed with all needs met. Boyfriend in bed.   Caesar Bookman Mobility Specialist Please contact via SecureChat or Rehab Office [phone]
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
## 2023-08-03 NOTE — Progress Notes (Signed)
Nurse requested Mobility Specialist to perform oxygen saturation test with pt which includes removing pt from oxygen both at rest and while ambulating.  Below are the results from that testing.     Patient Saturations on Room Air at Rest = spO2 100%  Patient Saturations on Room Air while Ambulating = sp02 96% .    Patient Saturations on NA Liters of oxygen while Ambulating = sp02 NA%  At end of testing pt left in room on NA  Liters of oxygen.  Reported results to nurse.    Caesar Bookman Mobility Specialist Please contact via SecureChat or Rehab Office [phone]

## 2023-08-03 NOTE — Plan of Care (Signed)

## 2023-08-03 NOTE — Discharge Summary (Signed)
EPINEPHrine 0.3 mg/0.3 mL Soaj injection Commonly known as: EPI-PEN Inject 0.3 mg into the muscle as needed for anaphylaxis.   hydrOXYzine 25 MG tablet Commonly known as: ATARAX Take 1 tablet (25 mg total) by mouth every 8 (eight) hours as needed. What  changed:  when to take this reasons to take this   ibuprofen 200 MG tablet Commonly known as: ADVIL Take 400 mg by mouth as needed for moderate pain.   MAGNESIUM PO Take 2 tablets by mouth at bedtime.   mometasone-formoterol 200-5 MCG/ACT Aero Commonly known as: DULERA Inhale 2 puffs into the lungs 2 (two) times daily.   multivitamin with minerals tablet Take 1 tablet by mouth daily.   pantoprazole 40 MG tablet Commonly known as: PROTONIX Take 1 tablet (40 mg total) by mouth 2 (two) times daily before a meal.               Durable Medical Equipment  (From admission, onward)           Start     Ordered   08/02/23 0959  For home use only DME Nebulizer machine  Once       Comments: "With mask and supplies  Question Answer Comment  Patient needs a nebulizer to treat with the following condition Asthma   Length of Need 12 Months      08/02/23 0958            Follow-up Information     Debbie Croon, MD Follow up.   Specialty: Otolaryngology Contact information: [address] [place] [address] [phone]                Allergies  Allergen Reactions   Shellfish Allergy Anaphylaxis    Consultations: ENT PCCM   Procedures/Studies: DG Chest Port 1 View  Result Date: 08/02/2023 CLINICAL DATA:  Chest pain EXAM: PORTABLE CHEST 1 VIEW COMPARISON:  07/31/2023 FINDINGS: The heart size and mediastinal contours are within normal limits. Both lungs are clear. The visualized skeletal structures are unremarkable. IMPRESSION: No active disease. Electronically Signed   By: Alcide Clever M.D.   On: 08/02/2023 23:25   DG Swallowing Func-Speech Pathology  Result Date: 08/02/2023 Table formatting from the original result was not included. Modified Barium Swallow Study Patient Details Name: Debbie Stewart MRN: [id number] Date of Birth: [birth year] Today's Date: 08/02/2023 HPI/PMH: HPI: Pt admitted with hx of asthma attacks since having Covid 6  months ago. Exacerbated upon admission due to exposure to allergens. Pt was cleaning her bathroom and mixed bleach and vineagar and could sense the chemical exposure.  Pt says "I have a new apartment and OCD, so I had to clean it really well".  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  CT head/neck shows no sign of obstructed airway. Pts breathing is normal while sleeping per staff. Pt evaluated by ENT, note states "Stridor- it does appear her vocal cords are adducting when she is inspiring. She still has adequate airway. She has no lesions and the subglottis looks opens. I think aggressive reflux meds would be first line for this. BID nexium or Protonix." Pt recommended to f/u with Dr Irene Pap after d/c. Clinical Impression: Clinical Impression: Pt presents with normal swallow function and safety per MBSS. Mild deviations observed with piecemeal swallows of small boluses, in which pt spontaneously utilizes second swallow to clear oral cavity. Additional finding of oral holding, in which pt demonstrating small delay in A-P  Physician Discharge Summary  Thersa Mohiuddin NWG:[id number] DOB: 1996-05-04 DOA: 07/31/2023  PCP: [facility]  Admit date: 07/31/2023 Discharge date: 08/03/2023  Admitted From: (Home) Disposition:  (Home )  Recommendations for Outpatient Follow-up:  Follow up with PCP in 1-2 weeks Please obtain BMP/CBC in one week Please charted to keep her appointment with allergist as an outpatient scheduled for next month, and to call and schedule with NT Dr. Melba Coon as an outpatient.  Home Health: Will arrange for outpatient SLP   Diet recommendation: Regular diet  Brief/Interim Summary:  Debbie Stewart is a 27 y.o. female with medical history significant of asthma, anxiety, PNES, menorrhagia with anemia.  Pt with h/o anaphylaxis to shellfish ED visit on 9/25.  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  Remained with persistent wheezing, so she was evaluated by Dr. Irene Pap Repeat scope exam performed by today (10/3) with evidence of abnormal adduction of true vocal folds on inspiration and patent subglottis and proximal trachea.  Patient much improved this morning after starting on PPIs, scheduled Xanax and nebulizer treatment,  Vocal cord dysfunction/PVFMD  Stridor -ENT input greatly appreciated, she was seen by ENT Dr. Jearld Fenton and Dr. Irene Pap, initial laryngoscopy performed 07/31/2023 by Dr. Jearld Fenton, and repeat laryngoscope by Dr. Irene Pap 08/02/2023 -Symptoms much improved, continue with adjuvant therapies including Protonix 40 mg p.o. twice daily on  discharge, she will be discharged on as needed Xanax as well, she will be discharged on Dulera, nebulizer machine/albuterol as needed nebulizer, and she is to follow with Dr. Melba Coon as an outpatient. -Stridor much improved, she ambulated in the hallway today, no dyspnea, no hypoxia.  Asthma exacerbation -Pulmonary input appreciated, patient presentation secondary to stridor not asthma exacerbation, continue with scheduled nebs and Dulera.   Leukocytosis -she is nontoxic-appearing, this is secondary to steroids    Discharge Diagnoses:  Principal Problem:   Asthma exacerbation Active Problems:   Stridor   Dyspnea   Chest tightness   Gastroesophageal reflux disease without esophagitis   Vocal fold dysfunction    Discharge Instructions  Discharge Instructions     Diet - low sodium heart healthy   Complete by: As directed    Increase activity slowly   Complete by: As directed       Allergies as of 08/03/2023       Reactions   Shellfish Allergy Anaphylaxis        Medication List     STOP taking these medications    famotidine 20 MG tablet Commonly known as: PEPCID   LORazepam 0.5 MG tablet Commonly known as: ATIVAN   predniSONE 20 MG tablet Commonly known as: DELTASONE       TAKE these medications    acetaminophen 500 MG tablet Commonly known as: TYLENOL Take 1,000 mg by mouth as needed for moderate pain.   albuterol (2.5 MG/3ML) 0.083% nebulizer solution Commonly known as: PROVENTIL Take 3 mLs (2.5 mg total) by nebulization every 2 (two) hours as needed for shortness of breath.   ALPRAZolam 0.5 MG tablet Commonly known as: XANAX Take 1 tablet (0.5 mg total) by mouth 2 (two) times daily.  Physician Discharge Summary  Thersa Mohiuddin NWG:[id number] DOB: 1996-05-04 DOA: 07/31/2023  PCP: [facility]  Admit date: 07/31/2023 Discharge date: 08/03/2023  Admitted From: (Home) Disposition:  (Home )  Recommendations for Outpatient Follow-up:  Follow up with PCP in 1-2 weeks Please obtain BMP/CBC in one week Please charted to keep her appointment with allergist as an outpatient scheduled for next month, and to call and schedule with NT Dr. Melba Coon as an outpatient.  Home Health: Will arrange for outpatient SLP   Diet recommendation: Regular diet  Brief/Interim Summary:  Debbie Stewart is a 27 y.o. female with medical history significant of asthma, anxiety, PNES, menorrhagia with anemia.  Pt with h/o anaphylaxis to shellfish ED visit on 9/25.  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  Remained with persistent wheezing, so she was evaluated by Dr. Irene Pap Repeat scope exam performed by today (10/3) with evidence of abnormal adduction of true vocal folds on inspiration and patent subglottis and proximal trachea.  Patient much improved this morning after starting on PPIs, scheduled Xanax and nebulizer treatment,  Vocal cord dysfunction/PVFMD  Stridor -ENT input greatly appreciated, she was seen by ENT Dr. Jearld Fenton and Dr. Irene Pap, initial laryngoscopy performed 07/31/2023 by Dr. Jearld Fenton, and repeat laryngoscope by Dr. Irene Pap 08/02/2023 -Symptoms much improved, continue with adjuvant therapies including Protonix 40 mg p.o. twice daily on  discharge, she will be discharged on as needed Xanax as well, she will be discharged on Dulera, nebulizer machine/albuterol as needed nebulizer, and she is to follow with Dr. Melba Coon as an outpatient. -Stridor much improved, she ambulated in the hallway today, no dyspnea, no hypoxia.  Asthma exacerbation -Pulmonary input appreciated, patient presentation secondary to stridor not asthma exacerbation, continue with scheduled nebs and Dulera.   Leukocytosis -she is nontoxic-appearing, this is secondary to steroids    Discharge Diagnoses:  Principal Problem:   Asthma exacerbation Active Problems:   Stridor   Dyspnea   Chest tightness   Gastroesophageal reflux disease without esophagitis   Vocal fold dysfunction    Discharge Instructions  Discharge Instructions     Diet - low sodium heart healthy   Complete by: As directed    Increase activity slowly   Complete by: As directed       Allergies as of 08/03/2023       Reactions   Shellfish Allergy Anaphylaxis        Medication List     STOP taking these medications    famotidine 20 MG tablet Commonly known as: PEPCID   LORazepam 0.5 MG tablet Commonly known as: ATIVAN   predniSONE 20 MG tablet Commonly known as: DELTASONE       TAKE these medications    acetaminophen 500 MG tablet Commonly known as: TYLENOL Take 1,000 mg by mouth as needed for moderate pain.   albuterol (2.5 MG/3ML) 0.083% nebulizer solution Commonly known as: PROVENTIL Take 3 mLs (2.5 mg total) by nebulization every 2 (two) hours as needed for shortness of breath.   ALPRAZolam 0.5 MG tablet Commonly known as: XANAX Take 1 tablet (0.5 mg total) by mouth 2 (two) times daily.  Physician Discharge Summary  Thersa Mohiuddin NWG:[id number] DOB: 1996-05-04 DOA: 07/31/2023  PCP: [facility]  Admit date: 07/31/2023 Discharge date: 08/03/2023  Admitted From: (Home) Disposition:  (Home )  Recommendations for Outpatient Follow-up:  Follow up with PCP in 1-2 weeks Please obtain BMP/CBC in one week Please charted to keep her appointment with allergist as an outpatient scheduled for next month, and to call and schedule with NT Dr. Melba Coon as an outpatient.  Home Health: Will arrange for outpatient SLP   Diet recommendation: Regular diet  Brief/Interim Summary:  Debbie Stewart is a 27 y.o. female with medical history significant of asthma, anxiety, PNES, menorrhagia with anemia.  Pt with h/o anaphylaxis to shellfish ED visit on 9/25.  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  Remained with persistent wheezing, so she was evaluated by Dr. Irene Pap Repeat scope exam performed by today (10/3) with evidence of abnormal adduction of true vocal folds on inspiration and patent subglottis and proximal trachea.  Patient much improved this morning after starting on PPIs, scheduled Xanax and nebulizer treatment,  Vocal cord dysfunction/PVFMD  Stridor -ENT input greatly appreciated, she was seen by ENT Dr. Jearld Fenton and Dr. Irene Pap, initial laryngoscopy performed 07/31/2023 by Dr. Jearld Fenton, and repeat laryngoscope by Dr. Irene Pap 08/02/2023 -Symptoms much improved, continue with adjuvant therapies including Protonix 40 mg p.o. twice daily on  discharge, she will be discharged on as needed Xanax as well, she will be discharged on Dulera, nebulizer machine/albuterol as needed nebulizer, and she is to follow with Dr. Melba Coon as an outpatient. -Stridor much improved, she ambulated in the hallway today, no dyspnea, no hypoxia.  Asthma exacerbation -Pulmonary input appreciated, patient presentation secondary to stridor not asthma exacerbation, continue with scheduled nebs and Dulera.   Leukocytosis -she is nontoxic-appearing, this is secondary to steroids    Discharge Diagnoses:  Principal Problem:   Asthma exacerbation Active Problems:   Stridor   Dyspnea   Chest tightness   Gastroesophageal reflux disease without esophagitis   Vocal fold dysfunction    Discharge Instructions  Discharge Instructions     Diet - low sodium heart healthy   Complete by: As directed    Increase activity slowly   Complete by: As directed       Allergies as of 08/03/2023       Reactions   Shellfish Allergy Anaphylaxis        Medication List     STOP taking these medications    famotidine 20 MG tablet Commonly known as: PEPCID   LORazepam 0.5 MG tablet Commonly known as: ATIVAN   predniSONE 20 MG tablet Commonly known as: DELTASONE       TAKE these medications    acetaminophen 500 MG tablet Commonly known as: TYLENOL Take 1,000 mg by mouth as needed for moderate pain.   albuterol (2.5 MG/3ML) 0.083% nebulizer solution Commonly known as: PROVENTIL Take 3 mLs (2.5 mg total) by nebulization every 2 (two) hours as needed for shortness of breath.   ALPRAZolam 0.5 MG tablet Commonly known as: XANAX Take 1 tablet (0.5 mg total) by mouth 2 (two) times daily.  Physician Discharge Summary  Thersa Mohiuddin NWG:[id number] DOB: 1996-05-04 DOA: 07/31/2023  PCP: [facility]  Admit date: 07/31/2023 Discharge date: 08/03/2023  Admitted From: (Home) Disposition:  (Home )  Recommendations for Outpatient Follow-up:  Follow up with PCP in 1-2 weeks Please obtain BMP/CBC in one week Please charted to keep her appointment with allergist as an outpatient scheduled for next month, and to call and schedule with NT Dr. Melba Coon as an outpatient.  Home Health: Will arrange for outpatient SLP   Diet recommendation: Regular diet  Brief/Interim Summary:  Debbie Stewart is a 27 y.o. female with medical history significant of asthma, anxiety, PNES, menorrhagia with anemia.  Pt with h/o anaphylaxis to shellfish ED visit on 9/25.  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  Remained with persistent wheezing, so she was evaluated by Dr. Irene Pap Repeat scope exam performed by today (10/3) with evidence of abnormal adduction of true vocal folds on inspiration and patent subglottis and proximal trachea.  Patient much improved this morning after starting on PPIs, scheduled Xanax and nebulizer treatment,  Vocal cord dysfunction/PVFMD  Stridor -ENT input greatly appreciated, she was seen by ENT Dr. Jearld Fenton and Dr. Irene Pap, initial laryngoscopy performed 07/31/2023 by Dr. Jearld Fenton, and repeat laryngoscope by Dr. Irene Pap 08/02/2023 -Symptoms much improved, continue with adjuvant therapies including Protonix 40 mg p.o. twice daily on  discharge, she will be discharged on as needed Xanax as well, she will be discharged on Dulera, nebulizer machine/albuterol as needed nebulizer, and she is to follow with Dr. Melba Coon as an outpatient. -Stridor much improved, she ambulated in the hallway today, no dyspnea, no hypoxia.  Asthma exacerbation -Pulmonary input appreciated, patient presentation secondary to stridor not asthma exacerbation, continue with scheduled nebs and Dulera.   Leukocytosis -she is nontoxic-appearing, this is secondary to steroids    Discharge Diagnoses:  Principal Problem:   Asthma exacerbation Active Problems:   Stridor   Dyspnea   Chest tightness   Gastroesophageal reflux disease without esophagitis   Vocal fold dysfunction    Discharge Instructions  Discharge Instructions     Diet - low sodium heart healthy   Complete by: As directed    Increase activity slowly   Complete by: As directed       Allergies as of 08/03/2023       Reactions   Shellfish Allergy Anaphylaxis        Medication List     STOP taking these medications    famotidine 20 MG tablet Commonly known as: PEPCID   LORazepam 0.5 MG tablet Commonly known as: ATIVAN   predniSONE 20 MG tablet Commonly known as: DELTASONE       TAKE these medications    acetaminophen 500 MG tablet Commonly known as: TYLENOL Take 1,000 mg by mouth as needed for moderate pain.   albuterol (2.5 MG/3ML) 0.083% nebulizer solution Commonly known as: PROVENTIL Take 3 mLs (2.5 mg total) by nebulization every 2 (two) hours as needed for shortness of breath.   ALPRAZolam 0.5 MG tablet Commonly known as: XANAX Take 1 tablet (0.5 mg total) by mouth 2 (two) times daily.  Physician Discharge Summary  Thersa Mohiuddin NWG:[id number] DOB: 1996-05-04 DOA: 07/31/2023  PCP: [facility]  Admit date: 07/31/2023 Discharge date: 08/03/2023  Admitted From: (Home) Disposition:  (Home )  Recommendations for Outpatient Follow-up:  Follow up with PCP in 1-2 weeks Please obtain BMP/CBC in one week Please charted to keep her appointment with allergist as an outpatient scheduled for next month, and to call and schedule with NT Dr. Melba Coon as an outpatient.  Home Health: Will arrange for outpatient SLP   Diet recommendation: Regular diet  Brief/Interim Summary:  Debbie Stewart is a 27 y.o. female with medical history significant of asthma, anxiety, PNES, menorrhagia with anemia.  Pt with h/o anaphylaxis to shellfish ED visit on 9/25.  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  Remained with persistent wheezing, so she was evaluated by Dr. Irene Pap Repeat scope exam performed by today (10/3) with evidence of abnormal adduction of true vocal folds on inspiration and patent subglottis and proximal trachea.  Patient much improved this morning after starting on PPIs, scheduled Xanax and nebulizer treatment,  Vocal cord dysfunction/PVFMD  Stridor -ENT input greatly appreciated, she was seen by ENT Dr. Jearld Fenton and Dr. Irene Pap, initial laryngoscopy performed 07/31/2023 by Dr. Jearld Fenton, and repeat laryngoscope by Dr. Irene Pap 08/02/2023 -Symptoms much improved, continue with adjuvant therapies including Protonix 40 mg p.o. twice daily on  discharge, she will be discharged on as needed Xanax as well, she will be discharged on Dulera, nebulizer machine/albuterol as needed nebulizer, and she is to follow with Dr. Melba Coon as an outpatient. -Stridor much improved, she ambulated in the hallway today, no dyspnea, no hypoxia.  Asthma exacerbation -Pulmonary input appreciated, patient presentation secondary to stridor not asthma exacerbation, continue with scheduled nebs and Dulera.   Leukocytosis -she is nontoxic-appearing, this is secondary to steroids    Discharge Diagnoses:  Principal Problem:   Asthma exacerbation Active Problems:   Stridor   Dyspnea   Chest tightness   Gastroesophageal reflux disease without esophagitis   Vocal fold dysfunction    Discharge Instructions  Discharge Instructions     Diet - low sodium heart healthy   Complete by: As directed    Increase activity slowly   Complete by: As directed       Allergies as of 08/03/2023       Reactions   Shellfish Allergy Anaphylaxis        Medication List     STOP taking these medications    famotidine 20 MG tablet Commonly known as: PEPCID   LORazepam 0.5 MG tablet Commonly known as: ATIVAN   predniSONE 20 MG tablet Commonly known as: DELTASONE       TAKE these medications    acetaminophen 500 MG tablet Commonly known as: TYLENOL Take 1,000 mg by mouth as needed for moderate pain.   albuterol (2.5 MG/3ML) 0.083% nebulizer solution Commonly known as: PROVENTIL Take 3 mLs (2.5 mg total) by nebulization every 2 (two) hours as needed for shortness of breath.   ALPRAZolam 0.5 MG tablet Commonly known as: XANAX Take 1 tablet (0.5 mg total) by mouth 2 (two) times daily.

## 2023-08-03 NOTE — Plan of Care (Signed)
Pt has rested quietly throughout the night with no distress noted. Alert and oriented. On room air. SR on the monitor. Up to BR to void. Medicated for anxiety and pain with relief noted. EKG done, PCXR done and labs drawn per orders for musculoskeletal chest and shoulder pain she was experiencing. No further complaints voiced.     Problem: Education: Goal: Knowledge of General Education information will improve Description: Including pain rating scale, medication(s)/side effects and non-pharmacologic comfort measures Outcome: Progressing   Problem: Health Behavior/Discharge Planning: Goal: Ability to manage health-related needs will improve Outcome: Progressing   Problem: Clinical Measurements: Goal: Ability to maintain clinical measurements within normal limits will improve Outcome: Progressing Goal: Will remain free from infection Outcome: Progressing Goal: Diagnostic test results will improve Outcome: Progressing Goal: Respiratory complications will improve Outcome: Progressing Goal: Cardiovascular complication will be avoided Outcome: Progressing   Problem: Activity: Goal: Risk for activity intolerance will decrease Outcome: Progressing   Problem: Nutrition: Goal: Adequate nutrition will be maintained Outcome: Progressing   Problem: Coping: Goal: Level of anxiety will decrease Outcome: Progressing   Problem: Elimination: Goal: Will not experience complications related to bowel motility Outcome: Progressing Goal: Will not experience complications related to urinary retention Outcome: Progressing   Problem: Pain Managment: Goal: General experience of comfort will improve Outcome: Progressing   Problem: Safety: Goal: Ability to remain free from injury will improve Outcome: Progressing   Problem: Skin Integrity: Goal: Risk for impaired skin integrity will decrease Outcome: Progressing

## 2023-09-12 ENCOUNTER — Inpatient Hospital Stay (HOSPITAL_BASED_OUTPATIENT_CLINIC_OR_DEPARTMENT_OTHER)
Admission: EM | Admit: 2023-09-12 | Discharge: 2023-09-14 | DRG: 203 | Disposition: A | Discharge status: Home / Self Care | Payer: BC Managed Care – PPO | Source: Ambulatory Visit | Attending: Internal Medicine | Admitting: Internal Medicine

## 2023-09-12 ENCOUNTER — Other Ambulatory Visit (HOSPITAL_BASED_OUTPATIENT_CLINIC_OR_DEPARTMENT_OTHER): Payer: Self-pay

## 2023-09-12 ENCOUNTER — Emergency Department (HOSPITAL_BASED_OUTPATIENT_CLINIC_OR_DEPARTMENT_OTHER): Payer: BC Managed Care – PPO

## 2023-09-12 ENCOUNTER — Encounter (HOSPITAL_BASED_OUTPATIENT_CLINIC_OR_DEPARTMENT_OTHER): Payer: Self-pay

## 2023-09-12 ENCOUNTER — Other Ambulatory Visit: Payer: Self-pay

## 2023-09-12 DIAGNOSIS — R0603 Acute respiratory distress: Secondary | ICD-10-CM | POA: Diagnosis not present

## 2023-09-12 DIAGNOSIS — Z91013 Allergy to seafood: Secondary | ICD-10-CM

## 2023-09-12 DIAGNOSIS — T782XXS Anaphylactic shock, unspecified, sequela: Secondary | ICD-10-CM

## 2023-09-12 DIAGNOSIS — K219 Gastro-esophageal reflux disease without esophagitis: Secondary | ICD-10-CM | POA: Diagnosis not present

## 2023-09-12 DIAGNOSIS — E039 Hypothyroidism, unspecified: Secondary | ICD-10-CM | POA: Diagnosis not present

## 2023-09-12 DIAGNOSIS — E876 Hypokalemia: Secondary | ICD-10-CM | POA: Diagnosis not present

## 2023-09-12 DIAGNOSIS — Z8249 Family history of ischemic heart disease and other diseases of the circulatory system: Secondary | ICD-10-CM | POA: Diagnosis not present

## 2023-09-12 DIAGNOSIS — J4541 Moderate persistent asthma with (acute) exacerbation: Secondary | ICD-10-CM

## 2023-09-12 DIAGNOSIS — J45901 Unspecified asthma with (acute) exacerbation: Secondary | ICD-10-CM | POA: Diagnosis present

## 2023-09-12 DIAGNOSIS — Z7951 Long term (current) use of inhaled steroids: Secondary | ICD-10-CM | POA: Diagnosis not present

## 2023-09-12 DIAGNOSIS — Z7952 Long term (current) use of systemic steroids: Secondary | ICD-10-CM

## 2023-09-12 DIAGNOSIS — Z832 Family history of diseases of the blood and blood-forming organs and certain disorders involving the immune mechanism: Secondary | ICD-10-CM

## 2023-09-12 DIAGNOSIS — Z1152 Encounter for screening for COVID-19: Secondary | ICD-10-CM | POA: Diagnosis not present

## 2023-09-12 DIAGNOSIS — J383 Other diseases of vocal cords: Secondary | ICD-10-CM | POA: Diagnosis present

## 2023-09-12 DIAGNOSIS — F419 Anxiety disorder, unspecified: Secondary | ICD-10-CM | POA: Diagnosis not present

## 2023-09-12 DIAGNOSIS — R0902 Hypoxemia: Secondary | ICD-10-CM | POA: Diagnosis present

## 2023-09-12 LAB — I-STAT VENOUS BLOOD GAS, ED
Acid-base deficit: 7 mmol/L — ABNORMAL HIGH (ref 0.0–2.0)
Bicarbonate: 18.2 mmol/L — ABNORMAL LOW (ref 20.0–28.0)
Calcium, Ion: 1.18 mmol/L (ref 1.15–1.40)
HCT: 33 % — ABNORMAL LOW (ref 36.0–46.0)
Hemoglobin: 11.2 g/dL — ABNORMAL LOW (ref 12.0–15.0)
O2 Saturation: 76 %
Patient temperature: 98.8
Potassium: 3 mmol/L — ABNORMAL LOW (ref 3.5–5.1)
Sodium: 138 mmol/L (ref 135–145)
TCO2: 19 mmol/L — ABNORMAL LOW (ref 22–32)
pCO2, Ven: 35.1 mm[Hg] — ABNORMAL LOW (ref 44–60)
pH, Ven: 7.324 (ref 7.25–7.43)
pO2, Ven: 44 mm[Hg] (ref 32–45)

## 2023-09-12 LAB — BASIC METABOLIC PANEL
Anion gap: 11 (ref 5–15)
BUN: 8 mg/dL (ref 6–20)
CO2: 25 mmol/L (ref 22–32)
Calcium: 9.8 mg/dL (ref 8.9–10.3)
Chloride: 105 mmol/L (ref 98–111)
Creatinine, Ser: 0.78 mg/dL (ref 0.44–1.00)
GFR, Estimated: >60 mL/min (ref >60)
Glucose, Bld: 87 mg/dL (ref 70–99)
Potassium: 3.3 mmol/L — ABNORMAL LOW (ref 3.5–5.1)
Sodium: 141 mmol/L (ref 135–145)

## 2023-09-12 LAB — RESP PANEL BY RT-PCR (RSV, FLU A&B, COVID)  RVPGX2
Influenza A by PCR: NEGATIVE
Influenza B by PCR: NEGATIVE
Resp Syncytial Virus by PCR: NEGATIVE
SARS Coronavirus 2 by RT PCR: NEGATIVE

## 2023-09-12 LAB — CBC WITH DIFFERENTIAL/PLATELET
Abs Immature Granulocytes: 0.03 10*3/uL (ref 0.00–0.07)
Basophils Absolute: 0 10*3/uL (ref 0.0–0.1)
Basophils Relative: 0 %
Eosinophils Absolute: 0 10*3/uL (ref 0.0–0.5)
Eosinophils Relative: 0 %
HCT: 35.1 % — ABNORMAL LOW (ref 36.0–46.0)
Hemoglobin: 10.6 g/dL — ABNORMAL LOW (ref 12.0–15.0)
Immature Granulocytes: 0 %
Lymphocytes Relative: 22 %
Lymphs Abs: 2 10*3/uL (ref 0.7–4.0)
MCH: 24.7 pg — ABNORMAL LOW (ref 26.0–34.0)
MCHC: 30.2 g/dL (ref 30.0–36.0)
MCV: 81.8 fL (ref 80.0–100.0)
Monocytes Absolute: 0.3 10*3/uL (ref 0.1–1.0)
Monocytes Relative: 4 %
Neutro Abs: 6.5 10*3/uL (ref 1.7–7.7)
Neutrophils Relative %: 74 %
Platelets: 443 10*3/uL — ABNORMAL HIGH (ref 150–400)
RBC: 4.29 MIL/uL (ref 3.87–5.11)
RDW: 16.3 % — ABNORMAL HIGH (ref 11.5–15.5)
WBC: 8.9 10*3/uL (ref 4.0–10.5)
nRBC: 0 % (ref 0.0–0.2)

## 2023-09-12 LAB — MRSA NEXT GEN BY PCR, NASAL: MRSA by PCR Next Gen: NOT DETECTED

## 2023-09-12 MED ORDER — FAMOTIDINE IN NACL 20-0.9 MG/50ML-% IV SOLN
20.0000 mg | Freq: Two times a day (BID) | INTRAVENOUS | Status: DC
Start: 2023-09-13 — End: 2023-09-14
  Administered 2023-09-13 – 2023-09-14 (×4): 20 mg via INTRAVENOUS
  Filled 2023-09-12 (×4): qty 50

## 2023-09-12 MED ORDER — METHYLPREDNISOLONE SODIUM SUCC 125 MG IJ SOLR
60.0000 mg | Freq: Four times a day (QID) | INTRAMUSCULAR | Status: DC
Start: 2023-09-13 — End: 2023-09-14
  Administered 2023-09-13 – 2023-09-14 (×6): 60 mg via INTRAVENOUS
  Filled 2023-09-12 (×6): qty 2

## 2023-09-12 MED ORDER — EPINEPHRINE 0.3 MG/0.3ML IJ SOAJ
0.3000 mg | Freq: Once | INTRAMUSCULAR | Status: AC
  Administered 2023-09-12: 0.3 mg via INTRAMUSCULAR
  Filled 2023-09-12: qty 0.3

## 2023-09-12 MED ORDER — FAMOTIDINE 20 MG PO TABS: 20.0000 mg | ORAL_TABLET | Freq: Two times a day (BID) | ORAL | Status: DC

## 2023-09-12 MED ORDER — DIPHENHYDRAMINE HCL 50 MG/ML IJ SOLN
50.0000 mg | Freq: Four times a day (QID) | INTRAMUSCULAR | Status: DC
  Administered 2023-09-12 – 2023-09-14 (×7): 50 mg via INTRAVENOUS
  Filled 2023-09-12 (×7): qty 1

## 2023-09-12 MED ORDER — IPRATROPIUM BROMIDE 0.02 % IN SOLN: 0.5000 mg | Freq: Once | RESPIRATORY_TRACT | Status: AC

## 2023-09-12 MED ORDER — ALBUTEROL SULFATE (2.5 MG/3ML) 0.083% IN NEBU
INHALATION_SOLUTION | RESPIRATORY_TRACT | Status: AC
  Administered 2023-09-12: 10 mg via RESPIRATORY_TRACT
  Filled 2023-09-12: qty 12

## 2023-09-12 MED ORDER — IPRATROPIUM-ALBUTEROL 0.5-2.5 (3) MG/3ML IN SOLN
RESPIRATORY_TRACT | Status: AC
  Administered 2023-09-12: 3 mL via RESPIRATORY_TRACT
  Filled 2023-09-12: qty 3

## 2023-09-12 MED ORDER — POTASSIUM CHLORIDE CRYS ER 20 MEQ PO TBCR
40.0000 meq | EXTENDED_RELEASE_TABLET | Freq: Once | ORAL | Status: AC
  Administered 2023-09-12: 40 meq via ORAL
  Filled 2023-09-12: qty 2

## 2023-09-12 MED ORDER — LORAZEPAM 2 MG/ML IJ SOLN
1.0000 mg | Freq: Once | INTRAMUSCULAR | Status: AC
Start: 2023-09-12 — End: 2023-09-12
  Administered 2023-09-12: 1 mg via INTRAVENOUS
  Filled 2023-09-12: qty 1

## 2023-09-12 MED ORDER — RACEPINEPHRINE HCL 2.25 % IN NEBU
INHALATION_SOLUTION | RESPIRATORY_TRACT | Status: AC
  Filled 2023-09-12: qty 0.5

## 2023-09-12 MED ORDER — LORAZEPAM 2 MG/ML IJ SOLN
1.0000 mg | INTRAMUSCULAR | Status: DC | PRN
  Administered 2023-09-13: 1 mg via INTRAVENOUS

## 2023-09-12 MED ORDER — METHYLPREDNISOLONE SODIUM SUCC 125 MG IJ SOLR: 125.0000 mg | INTRAMUSCULAR | Status: DC

## 2023-09-12 MED ORDER — ALBUTEROL SULFATE (2.5 MG/3ML) 0.083% IN NEBU
10.0000 mg | INHALATION_SOLUTION | RESPIRATORY_TRACT | Status: DC
  Administered 2023-09-12 – 2023-09-13 (×2): 10 mg via RESPIRATORY_TRACT
  Filled 2023-09-12: qty 12

## 2023-09-12 MED ORDER — ONDANSETRON HCL 4 MG/2ML IJ SOLN: 4.0000 mg | Freq: Four times a day (QID) | INTRAMUSCULAR | Status: DC | PRN

## 2023-09-12 MED ORDER — ALBUTEROL SULFATE (2.5 MG/3ML) 0.083% IN NEBU
2.5000 mg | INHALATION_SOLUTION | RESPIRATORY_TRACT | Status: DC | PRN
  Administered 2023-09-12 – 2023-09-13 (×2): 2.5 mg via RESPIRATORY_TRACT
  Filled 2023-09-12 (×3): qty 3

## 2023-09-12 MED ORDER — METHYLPREDNISOLONE SODIUM SUCC 125 MG IJ SOLR
125.0000 mg | Freq: Once | INTRAMUSCULAR | Status: AC
  Administered 2023-09-12: 125 mg via INTRAVENOUS
  Filled 2023-09-12: qty 2

## 2023-09-12 MED ORDER — IPRATROPIUM BROMIDE 0.02 % IN SOLN
RESPIRATORY_TRACT | Status: AC
  Administered 2023-09-12: 0.5 mg via RESPIRATORY_TRACT
  Filled 2023-09-12: qty 5

## 2023-09-12 MED ORDER — ONDANSETRON HCL 4 MG PO TABS: 4.0000 mg | ORAL_TABLET | Freq: Four times a day (QID) | ORAL | Status: DC | PRN

## 2023-09-12 MED ORDER — ALBUTEROL SULFATE (2.5 MG/3ML) 0.083% IN NEBU
2.5000 mg | INHALATION_SOLUTION | RESPIRATORY_TRACT | 0 refills | Status: DC | PRN
  Filled 2023-09-12: qty 75, 3d supply, fill #0

## 2023-09-12 MED ORDER — CHLORHEXIDINE GLUCONATE CLOTH 2 % EX PADS
6.0000 | MEDICATED_PAD | Freq: Every day | CUTANEOUS | Status: DC
  Administered 2023-09-12: 6 via TOPICAL

## 2023-09-12 MED ORDER — ACETAMINOPHEN 650 MG RE SUPP: 650.0000 mg | Freq: Four times a day (QID) | RECTAL | Status: DC | PRN

## 2023-09-12 MED ORDER — LORAZEPAM 2 MG/ML IJ SOLN
INTRAMUSCULAR | Status: AC
  Administered 2023-09-12: 1 mg via INTRAVENOUS
  Filled 2023-09-12: qty 1

## 2023-09-12 MED ORDER — ALBUTEROL SULFATE (2.5 MG/3ML) 0.083% IN NEBU
5.0000 mg | INHALATION_SOLUTION | Freq: Once | RESPIRATORY_TRACT | Status: AC
Start: 2023-09-12 — End: 2023-09-12
  Administered 2023-09-12: 5 mg via RESPIRATORY_TRACT
  Filled 2023-09-12: qty 6

## 2023-09-12 MED ORDER — PREDNISONE 10 MG PO TABS
40.0000 mg | ORAL_TABLET | Freq: Every day | ORAL | 0 refills | Status: DC
  Filled 2023-09-12: qty 20, 5d supply, fill #0

## 2023-09-12 MED ORDER — METHYLPREDNISOLONE SODIUM SUCC 40 MG IJ SOLR
40.0000 mg | Freq: Four times a day (QID) | INTRAMUSCULAR | Status: DC
  Administered 2023-09-12: 40 mg via INTRAVENOUS
  Filled 2023-09-12: qty 1

## 2023-09-12 MED ORDER — ALBUTEROL (5 MG/ML) CONTINUOUS INHALATION SOLN: 10.0000 mg/h | INHALATION_SOLUTION | Freq: Once | RESPIRATORY_TRACT | Status: DC

## 2023-09-12 MED ORDER — FAMOTIDINE IN NACL 20-0.9 MG/50ML-% IV SOLN
20.0000 mg | Freq: Once | INTRAVENOUS | Status: AC
  Administered 2023-09-12: 20 mg via INTRAVENOUS
  Filled 2023-09-12: qty 50

## 2023-09-12 MED ORDER — ORAL CARE MOUTH RINSE: 15.0000 mL | OROMUCOSAL | Status: DC | PRN

## 2023-09-12 MED ORDER — ALBUTEROL SULFATE (2.5 MG/3ML) 0.083% IN NEBU
INHALATION_SOLUTION | RESPIRATORY_TRACT | Status: AC
  Administered 2023-09-12: 2.5 mg via RESPIRATORY_TRACT
  Filled 2023-09-12: qty 3

## 2023-09-12 MED ORDER — SORBITOL 70 % SOLN: 30.0000 mL | Freq: Every day | Status: DC | PRN

## 2023-09-12 MED ORDER — MELATONIN 3 MG PO TABS
3.0000 mg | ORAL_TABLET | Freq: Every evening | ORAL | Status: DC | PRN
  Administered 2023-09-13 – 2023-09-14 (×2): 3 mg via ORAL
  Filled 2023-09-12 (×2): qty 1

## 2023-09-12 MED ORDER — MAGNESIUM SULFATE 2 GM/50ML IV SOLN
2.0000 g | Freq: Once | INTRAVENOUS | Status: AC
  Administered 2023-09-12: 2 g via INTRAVENOUS
  Filled 2023-09-12: qty 50

## 2023-09-12 MED ORDER — RACEPINEPHRINE HCL 2.25 % IN NEBU
0.5000 mL | INHALATION_SOLUTION | RESPIRATORY_TRACT | Status: DC | PRN
  Administered 2023-09-13: 0.5 mL via RESPIRATORY_TRACT
  Filled 2023-09-12 (×2): qty 0.5

## 2023-09-12 MED ORDER — RACEPINEPHRINE HCL 2.25 % IN NEBU
0.5000 mL | INHALATION_SOLUTION | Freq: Once | RESPIRATORY_TRACT | Status: DC
  Filled 2023-09-12: qty 0.5

## 2023-09-12 MED ORDER — ALBUTEROL SULFATE HFA 108 (90 BASE) MCG/ACT IN AERS
1.0000 | INHALATION_SPRAY | Freq: Four times a day (QID) | RESPIRATORY_TRACT | 10 refills | Status: AC | PRN
  Filled 2023-09-12: qty 6.7, 25d supply, fill #0

## 2023-09-12 MED ORDER — LORAZEPAM 2 MG/ML PO CONC: 1.0000 mg | ORAL | Status: DC | PRN

## 2023-09-12 MED ORDER — ALBUTEROL SULFATE (2.5 MG/3ML) 0.083% IN NEBU: 2.5000 mg | INHALATION_SOLUTION | Freq: Once | RESPIRATORY_TRACT | Status: AC

## 2023-09-12 MED ORDER — LORAZEPAM 2 MG/ML IJ SOLN: 1.0000 mg | Freq: Once | INTRAMUSCULAR | Status: AC

## 2023-09-12 MED ORDER — RACEPINEPHRINE HCL 2.25 % IN NEBU: 0.5000 mL | INHALATION_SOLUTION | Freq: Once | RESPIRATORY_TRACT | Status: AC

## 2023-09-12 MED ORDER — ACETAMINOPHEN 325 MG PO TABS: 650.0000 mg | ORAL_TABLET | Freq: Four times a day (QID) | ORAL | Status: DC | PRN

## 2023-09-12 MED ORDER — ALBUTEROL SULFATE (2.5 MG/3ML) 0.083% IN NEBU: 10.0000 mg | INHALATION_SOLUTION | Freq: Once | RESPIRATORY_TRACT | Status: AC

## 2023-09-12 MED ORDER — LORAZEPAM 2 MG/ML IJ SOLN
1.0000 mg | Freq: Once | INTRAMUSCULAR | Status: AC
  Administered 2023-09-12: 1 mg via INTRAVENOUS
  Filled 2023-09-12: qty 1

## 2023-09-12 MED ORDER — DIPHENHYDRAMINE HCL 50 MG/ML IJ SOLN
50.0000 mg | Freq: Once | INTRAMUSCULAR | Status: AC
  Administered 2023-09-12: 50 mg via INTRAVENOUS
  Filled 2023-09-12: qty 1

## 2023-09-12 MED ORDER — IPRATROPIUM-ALBUTEROL 0.5-2.5 (3) MG/3ML IN SOLN: 3.0000 mL | Freq: Once | RESPIRATORY_TRACT | Status: AC

## 2023-09-12 NOTE — Progress Notes (Signed)
Riki Sheer PA provided report:  27 yo adm for asthma exacerbation still having some work of breathing.   Recent vocal cord dysfunction. S.p ENT eval in October.   After initial rx, noted to have trouble breathing. Noted to have stridor TRANSIENT. Previou anaphylaxis. Stridors improved.   C/o Nasuea and throat closing up.  But Oral exam wnl. S/p epinephrine and racemic epinephrine.  Cuurently on room air, relaxed. Diffuse wheeses, no stridor. Oral exam wnl. No indicatin felt by ER provider to image neck at this time.  Labs on Admission:  Results for orders placed or performed during the hospital encounter of 09/12/23 (from the past 24 hour(s))  Basic metabolic panel     Status: Abnormal   Collection Time: 09/12/23 10:40 AM  Result Value Ref Range   Sodium 141 135 - 145 mmol/L   Potassium 3.3 (L) 3.5 - 5.1 mmol/L   Chloride 105 98 - 111 mmol/L   CO2 25 22 - 32 mmol/L   Glucose, Bld 87 70 - 99 mg/dL   BUN 8 6 - 20 mg/dL   Creatinine, Ser 1.91 0.44 - 1.00 mg/dL   Calcium 9.8 8.9 - 47.8 mg/dL   GFR, Estimated >29 >56 mL/min   Anion gap 11 5 - 15  CBC with Differential/Platelet     Status: Abnormal   Collection Time: 09/12/23 10:40 AM  Result Value Ref Range   WBC 8.9 4.0 - 10.5 K/uL   RBC 4.29 3.87 - 5.11 MIL/uL   Hemoglobin 10.6 (L) 12.0 - 15.0 g/dL   HCT 21.3 (L) 08.6 - 57.8 %   MCV 81.8 80.0 - 100.0 fL   MCH 24.7 (L) 26.0 - 34.0 pg   MCHC 30.2 30.0 - 36.0 g/dL   RDW 46.9 (H) 62.9 - 52.8 %   Platelets 443 (H) 150 - 400 K/uL   nRBC 0.0 0.0 - 0.2 %   Neutrophils Relative % 74 %   Neutro Abs 6.5 1.7 - 7.7 K/uL   Lymphocytes Relative 22 %   Lymphs Abs 2.0 0.7 - 4.0 K/uL   Monocytes Relative 4 %   Monocytes Absolute 0.3 0.1 - 1.0 K/uL   Eosinophils Relative 0 %   Eosinophils Absolute 0.0 0.0 - 0.5 K/uL   Basophils Relative 0 %   Basophils Absolute 0.0 0.0 - 0.1 K/uL   Immature Granulocytes 0 %   Abs Immature Granulocytes 0.03 0.00 - 0.07 K/uL  Resp panel by RT-PCR (RSV,  Flu A&B, Covid) Anterior Nasal Swab     Status: None   Collection Time: 09/12/23  1:43 PM   Specimen: Anterior Nasal Swab  Result Value Ref Range   SARS Coronavirus 2 by RT PCR NEGATIVE NEGATIVE   Influenza A by PCR NEGATIVE NEGATIVE   Influenza B by PCR NEGATIVE NEGATIVE   Resp Syncytial Virus by PCR NEGATIVE NEGATIVE  I-Stat venous blood gas, ([facility], [facility], [facility])     Status: Abnormal   Collection Time: 09/12/23  2:09 PM  Result Value Ref Range   pH, Ven 7.324 7.25 - 7.43   pCO2, Ven 35.1 (L) 44 - 60 mmHg   pO2, Ven 44 32 - 45 mmHg   Bicarbonate 18.2 (L) 20.0 - 28.0 mmol/L   TCO2 19 (L) 22 - 32 mmol/L   O2 Saturation 76 %   Acid-base deficit 7.0 (H) 0.0 - 2.0 mmol/L   Sodium 138 135 - 145 mmol/L   Potassium 3.0 (L) 3.5 - 5.1 mmol/L   Calcium, Ion 1.18  1.15 - 1.40 mmol/L   HCT 33.0 (L) 36.0 - 46.0 %   Hemoglobin 11.2 (L) 12.0 - 15.0 g/dL   Patient temperature 21.3 F    Collection site IV start    Drawn by RT    Sample type VENOUS    Basic Metabolic Panel: Recent Labs  Lab 09/12/23 1040 09/12/23 1409  NA 141 138  K 3.3* 3.0*  CL 105  --   CO2 25  --   GLUCOSE 87  --   BUN 8  --   CREATININE 0.78  --   CALCIUM 9.8  --    Liver Function Tests: No results for input(s): "AST", "ALT", "ALKPHOS", "BILITOT", "PROT", "ALBUMIN" in the last 168 hours. No results for input(s): "LIPASE", "AMYLASE" in the last 168 hours. No results for input(s): "AMMONIA" in the last 168 hours. CBC: Recent Labs  Lab 09/12/23 1040 09/12/23 1409  WBC 8.9  --   NEUTROABS 6.5  --   HGB 10.6* 11.2*  HCT 35.1* 33.0*  MCV 81.8  --   PLT 443*  --    Cardiac Enzymes: No results for input(s): "CKTOTAL", "CKMB", "CKMBINDEX", "TROPONINIHS" in the last 168 hours.  BNP (last 3 results) No results for input(s): "PROBNP" in the last 8760 hours. CBG: No results for input(s): "GLUCAP" in the last 168 hours.  Radiological Exams on Admission:  DG Chest Port 1 View  Result Date: 09/12/2023 CLINICAL  DATA:  Asthma exacerbation, shortness of breath EXAM: PORTABLE CHEST 1 VIEW COMPARISON:  08/02/2023 FINDINGS: Cardiac and mediastinal contours are within normal limits. No focal pulmonary opacity. No pleural effusion or pneumothorax. No acute osseous abnormality. IMPRESSION: No acute cardiopulmonary process. Electronically Signed   By: Wiliam Ke M.D.   On: 09/12/2023 13:19

## 2023-09-12 NOTE — ED Notes (Signed)
[facility] here to transport patient to [facility].

## 2023-09-12 NOTE — ED Notes (Signed)
RT assessed the Pt and she was sleeping and was doing a lot better. The Pt stated that he left side of her chest was better than her right. The Pt's BBS seemed much better. She still has some coarsness

## 2023-09-12 NOTE — H&P (Signed)
History and Physical    Patient: Debbie Stewart WUJ:[id number] DOB: 10-Mar-1996 DOA: 09/12/2023 DOS: the patient was seen and examined on 09/12/2023 PCP: [facility]  Patient coming from: Home  Chief Complaint:  Chief Complaint  Patient presents with   Asthma Exacerbation   HPI: Shanece Hambric is a 27 y.o. female with medical history vocal cord dysfunction,  asthma and shellfish allergy presented to the emergency department with difficulty breathing.  She tells me she was fine when she went to bed last night.  The shortness of breath started this morning at about 815.  She went to work anyway and continue to get worse at work. Because the patient was in some respiratory distress at the time of my visit.    On arrival to the floor the patient had an another episode of respiratory distress.  She complained of chest pain which she says she associates with her asthma and difficulty breathing.  She was immediately given racemic epi and albuterol neb treatment.  It appears that she then 8 hours.  All of those medications were reordered.  Shortly there after the patient was able to breathe better.       Review of Systems: unable to review all systems due to the inability of the patient to answer questions. Past Medical History:  Diagnosis Date   Anemia    Anxiety    Asthma    Depression    Fibroid    Hyperthyroidism    Ovarian cyst    Seizures (HCC)    psuedo seizures,  stress related   Past Surgical History:  Procedure Laterality Date   DILATION AND CURETTAGE OF UTERUS     WISDOM TOOTH EXTRACTION     Social History:  reports that she has never smoked. She has never used smokeless tobacco. She reports that she does not currently use alcohol after a past usage of about 2.0 standard drinks of alcohol per week. She reports that she does not currently use drugs after having used the following drugs: Marijuana.  Allergies  Allergen Reactions   Shellfish  Allergy Anaphylaxis    Family History  Problem Relation Age of Onset   Hypertension Mother    Sickle cell trait Mother    Liver disease Mother    Healthy Father     Prior to Admission medications   Medication Sig Start Date End Date Taking? Authorizing Provider  albuterol (VENTOLIN HFA) 108 (90 Base) MCG/ACT inhaler Inhale 1 puff into the lungs every 6 (six) hours as needed for wheezing or shortness of breath. 09/12/23  Yes Gareth Eagle, PA-C  Azelastine HCl 137 MCG/SPRAY SOLN Place into both nostrils. 08/30/23  Yes [provider]  hydrocortisone 2.5 % cream Apply topically. 08/30/23  Yes [provider]  predniSONE (DELTASONE) 10 MG tablet Take 4 tablets (40 mg total) by mouth daily for 5 days. 09/12/23 09/17/23 Yes Gareth Eagle, PA-C  triamcinolone ointment (KENALOG) 0.1 % Apply 1 Application topically 2 (two) times daily. 08/30/23  Yes [provider]  acetaminophen (TYLENOL) 500 MG tablet Take 1,000 mg by mouth as needed for moderate pain.    [provider]  albuterol (PROVENTIL) (2.5 MG/3ML) 0.083% nebulizer solution Take 3 mLs (2.5 mg total) by nebulization every 2 (two) hours as needed for shortness of breath. 09/12/23   Gareth Eagle, PA-C  ALPRAZolam Prudy Feeler) 0.5 MG tablet Take 1 tablet (0.5 mg total) by mouth 2 (two) times daily. 08/03/23   Elgergawy, Mliss Fritz  S, MD  EPINEPHrine 0.3 mg/0.3 mL IJ SOAJ injection Inject 0.3 mg into the muscle as needed for anaphylaxis. Patient not taking: Reported on 08/01/2023 07/25/23   Charlynne Pander, MD  hydrOXYzine (ATARAX) 25 MG tablet Take 1 tablet (25 mg total) by mouth every 8 (eight) hours as needed. Patient taking differently: Take 25 mg by mouth 2 (two) times daily as needed for anxiety. 03/19/22   Marita Kansas, PA-C  ibuprofen (ADVIL) 200 MG tablet Take 400 mg by mouth as needed for moderate pain.    [provider]  MAGNESIUM PO Take 2 tablets by mouth at bedtime.    [provider]  mometasone-formoterol (DULERA) 200-5 MCG/ACT AERO Inhale 2 puffs into the lungs 2 (two) times daily. 08/03/23   Elgergawy, Leana Roe, MD  Multiple Vitamins-Minerals (MULTIVITAMIN WITH MINERALS) tablet Take 1 tablet by mouth daily.    [provider]  pantoprazole (PROTONIX) 40 MG tablet Take 1 tablet (40 mg total) by mouth 2 (two) times daily before a meal. 08/03/23   Elgergawy, Leana Roe, MD    Physical Exam: Vitals:   09/12/23 2034 09/12/23 2045 09/12/23 2104 09/12/23 2141  BP:    127/63  Pulse:  (!) 110  99  Resp:  (!) 26  17  Temp:  98.5 F (36.9 C)  98.6 F (37 C)  TempSrc:    Oral  SpO2: 100% 100% 100% 100%  Weight:      Height:       Physical Exam:  General: acute distress, well developed, well nourished HEENT: Normocephalic, atraumatic, PERRL Cardiovascular: Normal rate and rhythm. Distal pulses intact. Pulmonary: Normal breath sounds, lung fields clear/ no wheezing, mild stridor neck There is air movement. Gastrointestinal: Nondistended abdomen, soft, non-tender, normoactive bowel sounds Musculoskeletal:Normal ROM, no lower ext edema Skin: Skin is warm and dry. Neuro: No focal deficits noted, AAOx3. PSYCH: Attentive and cooperative  Data Reviewed: {Tip this will not be part of the note when signed- Document your independent interpretation of telemetry tracing, EKG, lab, Radiology test or any other diagnostic tests. Add any new diagnostic test ordered today. (Optional):[id number]} Results for orders placed or performed during the hospital encounter of 09/12/23 (from the past 24 hour(s))  Basic metabolic panel     Status: Abnormal   Collection Time: 09/12/23 10:40 AM  Result Value Ref Range   Sodium 141 135 - 145 mmol/L   Potassium 3.3 (L) 3.5 - 5.1 mmol/L   Chloride 105 98 - 111 mmol/L   CO2 25 22 - 32 mmol/L   Glucose, Bld 87 70 - 99 mg/dL   BUN 8 6 - 20 mg/dL   Creatinine, Ser 2.13 0.44 - 1.00 mg/dL   Calcium 9.8 8.9 - 08.6 mg/dL   GFR,  Estimated >57 >84 mL/min   Anion gap 11 5 - 15  CBC with Differential/Platelet     Status: Abnormal   Collection Time: 09/12/23 10:40 AM  Result Value Ref Range   WBC 8.9 4.0 - 10.5 K/uL   RBC 4.29 3.87 - 5.11 MIL/uL   Hemoglobin 10.6 (L) 12.0 - 15.0 g/dL   HCT 69.6 (L) 29.5 - 28.4 %   MCV 81.8 80.0 - 100.0 fL   MCH 24.7 (L) 26.0 - 34.0 pg   MCHC 30.2 30.0 - 36.0 g/dL   RDW 13.2 (H) 44.0 - 10.2 %   Platelets 443 (H) 150 - 400 K/uL   nRBC 0.0 0.0 - 0.2 %   Neutrophils Relative % 74 %  Neutro Abs 6.5 1.7 - 7.7 K/uL   Lymphocytes Relative 22 %   Lymphs Abs 2.0 0.7 - 4.0 K/uL   Monocytes Relative 4 %   Monocytes Absolute 0.3 0.1 - 1.0 K/uL   Eosinophils Relative 0 %   Eosinophils Absolute 0.0 0.0 - 0.5 K/uL   Basophils Relative 0 %   Basophils Absolute 0.0 0.0 - 0.1 K/uL   Immature Granulocytes 0 %   Abs Immature Granulocytes 0.03 0.00 - 0.07 K/uL  Resp panel by RT-PCR (RSV, Flu A&B, Covid) Anterior Nasal Swab     Status: None   Collection Time: 09/12/23  1:43 PM   Specimen: Anterior Nasal Swab  Result Value Ref Range   SARS Coronavirus 2 by RT PCR NEGATIVE NEGATIVE   Influenza A by PCR NEGATIVE NEGATIVE   Influenza B by PCR NEGATIVE NEGATIVE   Resp Syncytial Virus by PCR NEGATIVE NEGATIVE  I-Stat venous blood gas, ([facility], [facility], [facility])     Status: Abnormal   Collection Time: 09/12/23  2:09 PM  Result Value Ref Range   pH, Ven 7.324 7.25 - 7.43   pCO2, Ven 35.1 (L) 44 - 60 mmHg   pO2, Ven 44 32 - 45 mmHg   Bicarbonate 18.2 (L) 20.0 - 28.0 mmol/L   TCO2 19 (L) 22 - 32 mmol/L   O2 Saturation 76 %   Acid-base deficit 7.0 (H) 0.0 - 2.0 mmol/L   Sodium 138 135 - 145 mmol/L   Potassium 3.0 (L) 3.5 - 5.1 mmol/L   Calcium, Ion 1.18 1.15 - 1.40 mmol/L   HCT 33.0 (L) 36.0 - 46.0 %   Hemoglobin 11.2 (L) 12.0 - 15.0 g/dL   Patient temperature 95.6 F    Collection site IV start    Drawn by RT    Sample type VENOUS      Assessment and Plan: Anaphylaxis - ATC Scheduled Solumedrol/  Benedryl/ Pepcid.  Transition all meds to po once stable.     Advance Care Planning:   Code Status: Full Code   Consults: none  Family Communication: none Severity of Illness: The appropriate patient status for this patient is INPATIENT. Inpatient status is judged to be reasonable and necessary in order to provide the required intensity of service to ensure the patient's safety. The patient's presenting symptoms, physical exam findings, and initial radiographic and laboratory data in the context of their chronic comorbidities is felt to place them at high risk for further clinical deterioration. Furthermore, it is not anticipated that the patient will be medically stable for discharge from the hospital within 2 midnights of admission.   * I certify that at the point of admission it is my clinical judgment that the patient will require inpatient hospital care spanning beyond 2 midnights from the point of admission due to high intensity of service, high risk for further deterioration and high frequency of surveillance required.*  Author: Buena Irish, MD 09/12/2023 10:34 PM  For on call review [email or web address].

## 2023-09-12 NOTE — ED Provider Notes (Signed)
[place] EMERGENCY DEPARTMENT AT [facility] Provider Note   CSN: [id number] Arrival date & time: 09/12/23  1011     History  Chief Complaint  Patient presents with   Asthma Exacerbation   HPI Debbie Stewart is a 27 y.o. female with history of asthma, vocal cord dysfunction, seizures, hypothyroidism and anxiety presenting for asthma exacerbation.  Sent from urgent care [place] for concerns of asthma exacerbation.  States she is having trouble breathing and wheezing.  States her symptoms started again last night and have been persistent throughout the day.  Denies chest pain.  Denies cough and fever.  Patient notably anxious.   HPI     Home Medications Prior to Admission medications   Medication Sig Start Date End Date Taking? Authorizing Provider  albuterol (VENTOLIN HFA) 108 (90 Base) MCG/ACT inhaler Inhale 1 puff into the lungs every 6 (six) hours as needed for wheezing or shortness of breath. 09/12/23  Yes Gareth Eagle, PA-C  Azelastine HCl 137 MCG/SPRAY SOLN Place into both nostrils. 08/30/23  Yes [provider]  hydrocortisone 2.5 % cream Apply topically. 08/30/23  Yes [provider]  predniSONE (DELTASONE) 10 MG tablet Take 4 tablets (40 mg total) by mouth daily for 5 days. 09/12/23 09/17/23 Yes Gareth Eagle, PA-C  triamcinolone ointment (KENALOG) 0.1 % Apply 1 Application topically 2 (two) times daily. 08/30/23  Yes [provider]  acetaminophen (TYLENOL) 500 MG tablet Take 1,000 mg by mouth as needed for moderate pain.    [provider]  albuterol (PROVENTIL) (2.5 MG/3ML) 0.083% nebulizer solution Take 3 mLs (2.5 mg total) by nebulization every 2 (two) hours as needed for shortness of breath. 09/12/23   Gareth Eagle, PA-C  ALPRAZolam Prudy Feeler) 0.5 MG tablet Take 1 tablet (0.5 mg total) by mouth 2 (two) times daily. 08/03/23   Elgergawy, Leana Roe, MD  EPINEPHrine 0.3 mg/0.3 mL IJ SOAJ injection Inject 0.3 mg into the  muscle as needed for anaphylaxis. Patient not taking: Reported on 08/01/2023 07/25/23   Charlynne Pander, MD  hydrOXYzine (ATARAX) 25 MG tablet Take 1 tablet (25 mg total) by mouth every 8 (eight) hours as needed. Patient taking differently: Take 25 mg by mouth 2 (two) times daily as needed for anxiety. 03/19/22   Marita [place], PA-C  ibuprofen (ADVIL) 200 MG tablet Take 400 mg by mouth as needed for moderate pain.    [provider]  MAGNESIUM PO Take 2 tablets by mouth at bedtime.    [provider]  mometasone-formoterol (DULERA) 200-5 MCG/ACT AERO Inhale 2 puffs into the lungs 2 (two) times daily. 08/03/23   Elgergawy, Leana Roe, MD  Multiple Vitamins-Minerals (MULTIVITAMIN WITH MINERALS) tablet Take 1 tablet by mouth daily.    [provider]  pantoprazole (PROTONIX) 40 MG tablet Take 1 tablet (40 mg total) by mouth 2 (two) times daily before a meal. 08/03/23   Elgergawy, Leana Roe, MD      Allergies    Shellfish allergy    Review of Systems   See HPI for pertinent positives  Physical Exam Updated Vital Signs BP (!) 145/133   Pulse (!) 109   Temp 98.3 F (36.8 C) (Oral)   Resp 18   Ht 5\' 6"  (1.676 m)   Wt 70.3 kg   LMP 09/05/2023 (Approximate)   SpO2 90%   BMI 25.01 kg/m  Physical Exam Vitals and nursing note reviewed.  HENT:     Head: Normocephalic and atraumatic.  Mouth/Throat:     Mouth: Mucous membranes are moist.  Eyes:     General:        Right eye: No discharge.        Left eye: No discharge.     Conjunctiva/sclera: Conjunctivae normal.  Cardiovascular:     Rate and Rhythm: Normal rate and regular rhythm.     Pulses: Normal pulses.     Heart sounds: Normal heart sounds.  Pulmonary:     Effort: Tachypnea and respiratory distress present.     Breath sounds: No stridor or decreased air movement. Examination of the right-upper field reveals wheezing. Examination of the left-upper field reveals wheezing. Examination of the right-middle field  reveals wheezing. Examination of the left-middle field reveals wheezing. Examination of the right-lower field reveals wheezing. Examination of the left-lower field reveals wheezing. Wheezing present. No rales.  Abdominal:     General: Abdomen is flat.     Palpations: Abdomen is soft.  Skin:    General: Skin is warm and dry.  Neurological:     General: No focal deficit present.  Psychiatric:        Mood and Affect: Mood normal.     ED Results / Procedures / Treatments   Labs (all labs ordered are listed, but only abnormal results are displayed) Labs Reviewed  BASIC METABOLIC PANEL - Abnormal; Notable for the following components:      Result Value   Potassium 3.3 (*)    All other components within normal limits  CBC WITH DIFFERENTIAL/PLATELET - Abnormal; Notable for the following components:   Hemoglobin 10.6 (*)    HCT 35.1 (*)    MCH 24.7 (*)    RDW 16.3 (*)    Platelets 443 (*)    All other components within normal limits  I-STAT VENOUS BLOOD GAS, ED - Abnormal; Notable for the following components:   pCO2, Ven 35.1 (*)    Bicarbonate 18.2 (*)    TCO2 19 (*)    Acid-base deficit 7.0 (*)    Potassium 3.0 (*)    HCT 33.0 (*)    Hemoglobin 11.2 (*)    All other components within normal limits  RESP PANEL BY RT-PCR (RSV, FLU A&B, COVID)  [id number]    EKG None  Radiology DG Chest Port 1 View  Result Date: 09/12/2023 CLINICAL DATA:  Asthma exacerbation, shortness of breath EXAM: PORTABLE CHEST 1 VIEW COMPARISON:  08/02/2023 FINDINGS: Cardiac and mediastinal contours are within normal limits. No focal pulmonary opacity. No pleural effusion or pneumothorax. No acute osseous abnormality. IMPRESSION: No acute cardiopulmonary process. Electronically Signed   By: Wiliam Ke M.D.   On: 09/12/2023 13:19    Procedures .Critical Care  Performed by: Gareth Eagle, PA-C Authorized by: Gareth Eagle, PA-C   Critical care provider statement:    Critical care time (minutes):   30   Critical care was necessary to treat or prevent imminent or life-threatening deterioration of the following conditions:  Respiratory failure   Critical care was time spent personally by me on the following activities:  Development of treatment plan with patient or surrogate, discussions with consultants, evaluation of patient's response to treatment, examination of patient, ordering and review of laboratory studies, ordering and review of radiographic studies, ordering and performing treatments and interventions, pulse oximetry, re-evaluation of patient's condition and review of old charts     Medications Ordered in ED Medications  albuterol (PROVENTIL,VENTOLIN) solution continuous neb (10 mg/hr Nebulization Not Given 09/12/23 1225)  albuterol (PROVENTIL) (2.5 MG/3ML) 0.083% nebulizer solution 5 mg (has no administration in time range)  Racepinephrine HCl 2.25 % nebulizer solution 0.5 mL (0.5 mLs Nebulization Not Given 09/12/23 1352)  ipratropium-albuterol (DUONEB) 0.5-2.5 (3) MG/3ML nebulizer solution 3 mL (3 mLs Nebulization Given 09/12/23 1041)  albuterol (PROVENTIL) (2.5 MG/3ML) 0.083% nebulizer solution 2.5 mg (2.5 mg Nebulization Given 09/12/23 1041)  albuterol (PROVENTIL) (2.5 MG/3ML) 0.083% nebulizer solution 10 mg (10 mg Nebulization Given 09/12/23 1054)  magnesium sulfate IVPB 2 g 50 mL (0 g Intravenous Stopped 09/12/23 1232)  ipratropium (ATROVENT) nebulizer solution 0.5 mg (0.5 mg Nebulization Given 09/12/23 1054)  methylPREDNISolone sodium succinate (SOLU-MEDROL) 125 mg/2 mL injection 125 mg (125 mg Intravenous Given 09/12/23 1046)  LORazepam (ATIVAN) injection 1 mg (1 mg Intravenous Given 09/12/23 1046)  potassium chloride SA (KLOR-CON M) CR tablet 40 mEq (40 mEq Oral Given 09/12/23 1334)  EPINEPHrine (EPI-PEN) injection 0.3 mg (0.3 mg Intramuscular Given 09/12/23 1348)  Racepinephrine HCl 2.25 % nebulizer solution 0.5 mL ( Nebulization Given 09/12/23 1352)  diphenhydrAMINE  (BENADRYL) injection 50 mg (50 mg Intravenous Given 09/12/23 1400)  famotidine (PEPCID) IVPB 20 mg premix (20 mg Intravenous New Bag/Given 09/12/23 1402)    ED Course/ Medical Decision Making/ A&P Clinical Course as of 09/12/23 1533  Wed Sep 12, 2023  1315 On reassessment, stridorous and stating she was having some difficulty breathing along with diffuse expiratory inspiratory wheezing.  Concern for upper airway obstruction versus anaphylaxis at this time.  Treated with racemic epi, IM epi, IV Benadryl and Pepcid.  After treatment reassessed and could no longer hear stridor and patient states she had felt much better.  Admitted to hospital service for asthma exacerbation. [JR]    Clinical Course User Index [JR] Gareth Eagle, PA-C                                 Medical Decision Making Amount and/or Complexity of Data Reviewed Labs: ordered. Radiology: ordered.  Risk OTC drugs. Prescription drug management. Decision regarding hospitalization.   Initial Impression and Ddx 27 year old well-appearing female presenting in respiratory distress, tachypneic with diffuse expiratory wheezing.  Exam notable for diffuse wheezing but air entry sounded relatively normal.  Patient notably anxious.  DDx includes asthma exacerbation, pneumonia, ACS, CHF,pneumothorax, other. Patient PMH that increases complexity of ED encounter:  history of asthma, seizures, hypothyroidism and anxiety   Interpretation of Diagnostics - I independent reviewed and interpreted the labs as followed: anemia but above baseline, mild hypokalemia  - I independently visualized the following imaging with scope of interpretation limited to determining acute life threatening conditions related to emergency care: cxr, which revealed hyperexpanded lungs but no other acute cardiopulmonary prpcess   Patient Reassessment and Ultimate Disposition/Management On reassessment, stridorous and stating she was having some difficulty  breathing along with diffuse expiratory inspiratory wheezing.  Concern for upper airway obstruction versus anaphylaxis at this time.  Treated with racemic epi, IM epi, IV Benadryl and Pepcid.  After treatment reassessed and could no longer hear stridor and patient states she had felt much better.  Admitted to hospital service for asthma exacerbation with Dr. Nolberto Hanlon.  Sent refills of her albuterol nebulizer and albuterol inhaler to her pharmacy.  Patient management required discussion with the following services or consulting groups:  None  Complexity of Problems Addressed Acute complicated illness or Injury  Additional Data Reviewed and Analyzed Further history obtained from: Past medical history  and medications listed in the EMR and Prior ED visit notes  Patient Encounter Risk Assessment Prescriptions         Final Clinical Impression(s) / ED Diagnoses Final diagnoses:  Mild asthma with exacerbation, unspecified whether persistent    Rx / DC Orders ED Discharge Orders          Ordered    albuterol (PROVENTIL) (2.5 MG/3ML) 0.083% nebulizer solution  Every 2 hours PRN       Note to Pharmacy: 90 ml per pack   09/12/23 1329    albuterol (VENTOLIN HFA) 108 (90 Base) MCG/ACT inhaler  Every 6 hours PRN        09/12/23 1329    predniSONE (DELTASONE) 10 MG tablet  Daily        09/12/23 1329              Gareth Eagle, PA-C 09/12/23 1534    Ernie Avena, MD 09/12/23 1932    Ernie Avena, MD 09/12/23 1933

## 2023-09-12 NOTE — ED Notes (Signed)
RT placed the Pt on Humidified air. RT is hoping the humidity will help the Pt's throat. The doctor listened to the Pt an agreed with the humidity and he is going to get her some medicine to help

## 2023-09-12 NOTE — ED Notes (Signed)
Pt has asthma and vocal cord dysfunction. Pt seems to be having issues with both. Pt is struggling with the mid chest being tight. RT is trying to use different techniques to help her breathing. The Pt is struggling to do some of the breathing exercises due to the tightness in her chest.

## 2023-09-12 NOTE — ED Notes (Signed)
Patient c/o chest tightness. RT to bedside. BS diminished on R. Mild expiratory wheeze noted on L.Patient on room air, with humidity on standby.

## 2023-09-12 NOTE — ED Triage Notes (Signed)
Pt via pov from [facility] with asthma exacerbation today. Pt states she was hospitalized for the same a couple of weeks ago. Pt alert ;& oriented, obviously having difficulty breathing.

## 2023-09-13 DIAGNOSIS — Z1152 Encounter for screening for COVID-19: Secondary | ICD-10-CM | POA: Diagnosis not present

## 2023-09-13 DIAGNOSIS — K219 Gastro-esophageal reflux disease without esophagitis: Secondary | ICD-10-CM | POA: Diagnosis present

## 2023-09-13 DIAGNOSIS — J383 Other diseases of vocal cords: Secondary | ICD-10-CM | POA: Diagnosis present

## 2023-09-13 DIAGNOSIS — R0902 Hypoxemia: Secondary | ICD-10-CM | POA: Diagnosis present

## 2023-09-13 DIAGNOSIS — J45901 Unspecified asthma with (acute) exacerbation: Secondary | ICD-10-CM | POA: Diagnosis present

## 2023-09-13 DIAGNOSIS — E039 Hypothyroidism, unspecified: Secondary | ICD-10-CM | POA: Diagnosis present

## 2023-09-13 DIAGNOSIS — J4541 Moderate persistent asthma with (acute) exacerbation: Secondary | ICD-10-CM | POA: Diagnosis not present

## 2023-09-13 DIAGNOSIS — E876 Hypokalemia: Secondary | ICD-10-CM | POA: Diagnosis present

## 2023-09-13 DIAGNOSIS — Z832 Family history of diseases of the blood and blood-forming organs and certain disorders involving the immune mechanism: Secondary | ICD-10-CM | POA: Diagnosis not present

## 2023-09-13 DIAGNOSIS — Z8249 Family history of ischemic heart disease and other diseases of the circulatory system: Secondary | ICD-10-CM | POA: Diagnosis not present

## 2023-09-13 DIAGNOSIS — Z7952 Long term (current) use of systemic steroids: Secondary | ICD-10-CM | POA: Diagnosis not present

## 2023-09-13 DIAGNOSIS — F419 Anxiety disorder, unspecified: Secondary | ICD-10-CM | POA: Diagnosis present

## 2023-09-13 DIAGNOSIS — R0603 Acute respiratory distress: Secondary | ICD-10-CM | POA: Diagnosis present

## 2023-09-13 DIAGNOSIS — Z7951 Long term (current) use of inhaled steroids: Secondary | ICD-10-CM | POA: Diagnosis not present

## 2023-09-13 DIAGNOSIS — Z91013 Allergy to seafood: Secondary | ICD-10-CM | POA: Diagnosis not present

## 2023-09-13 LAB — BASIC METABOLIC PANEL
Anion gap: 10 (ref 5–15)
BUN: 6 mg/dL (ref 6–20)
CO2: 19 mmol/L — ABNORMAL LOW (ref 22–32)
Calcium: 9.4 mg/dL (ref 8.9–10.3)
Chloride: 108 mmol/L (ref 98–111)
Creatinine, Ser: 0.68 mg/dL (ref 0.44–1.00)
GFR, Estimated: >60 mL/min (ref >60)
Glucose, Bld: 157 mg/dL — ABNORMAL HIGH (ref 70–99)
Potassium: 3.9 mmol/L (ref 3.5–5.1)
Sodium: 137 mmol/L (ref 135–145)

## 2023-09-13 LAB — CBC
HCT: 28.5 % — ABNORMAL LOW (ref 36.0–46.0)
Hemoglobin: 8.7 g/dL — ABNORMAL LOW (ref 12.0–15.0)
MCH: 25.2 pg — ABNORMAL LOW (ref 26.0–34.0)
MCHC: 30.5 g/dL (ref 30.0–36.0)
MCV: 82.6 fL (ref 80.0–100.0)
Platelets: 389 10*3/uL (ref 150–400)
RBC: 3.45 MIL/uL — ABNORMAL LOW (ref 3.87–5.11)
RDW: 16.4 % — ABNORMAL HIGH (ref 11.5–15.5)
WBC: 20.7 10*3/uL — ABNORMAL HIGH (ref 4.0–10.5)
nRBC: 0 % (ref 0.0–0.2)

## 2023-09-13 LAB — MAGNESIUM: Magnesium: 2.1 mg/dL (ref 1.7–2.4)

## 2023-09-13 MED ORDER — ALPRAZOLAM 0.5 MG PO TABS
0.5000 mg | ORAL_TABLET | Freq: Two times a day (BID) | ORAL | Status: DC | PRN
  Administered 2023-09-13 – 2023-09-14 (×2): 0.5 mg via ORAL
  Filled 2023-09-13 (×2): qty 1

## 2023-09-13 MED ORDER — BENZONATATE 100 MG PO CAPS
100.0000 mg | ORAL_CAPSULE | Freq: Three times a day (TID) | ORAL | Status: DC
  Administered 2023-09-13 – 2023-09-14 (×2): 100 mg via ORAL
  Filled 2023-09-13 (×2): qty 1

## 2023-09-13 MED ORDER — GUAIFENESIN ER 600 MG PO TB12
600.0000 mg | ORAL_TABLET | Freq: Two times a day (BID) | ORAL | Status: DC
  Administered 2023-09-13 – 2023-09-14 (×2): 600 mg via ORAL
  Filled 2023-09-13 (×2): qty 1

## 2023-09-13 MED ORDER — ALBUTEROL SULFATE (2.5 MG/3ML) 0.083% IN NEBU
2.5000 mg | INHALATION_SOLUTION | Freq: Four times a day (QID) | RESPIRATORY_TRACT | Status: DC
  Administered 2023-09-13 – 2023-09-14 (×6): 2.5 mg via RESPIRATORY_TRACT
  Filled 2023-09-13 (×6): qty 3

## 2023-09-13 MED ORDER — LORAZEPAM 2 MG/ML IJ SOLN
0.5000 mg | Freq: Once | INTRAMUSCULAR | Status: AC
  Administered 2023-09-13: 0.5 mg via INTRAVENOUS
  Filled 2023-09-13: qty 1

## 2023-09-13 MED ORDER — HYDROCOD POLI-CHLORPHE POLI ER 10-8 MG/5ML PO SUER
5.0000 mL | Freq: Two times a day (BID) | ORAL | Status: DC | PRN
  Administered 2023-09-13 – 2023-09-14 (×2): 5 mL via ORAL
  Filled 2023-09-13 (×2): qty 5

## 2023-09-13 NOTE — Hospital Course (Signed)
Brief hospital course: PMH of vocal cord dysfunction and asthma presented to hospital with complaints of cough and shortness of breath. 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 Was recently seen in hospital by ENT and was referred to ENT outpatient but unfortunately has not had a chance to see them. Currently being treated for asthma exacerbation. I do not think that that she has an anaphylactic reaction.  Assessment and Plan: Asthma exacerbation. Oxygenation improving but still on 2 LPM. Continue albuterol scheduled. Continue steroid. Monitor for now.  Concern for anaphylactic reaction. Ruled out. Patient is on Benadryl which I will continue for today but will discontinue tomorrow. Also on Pepcid which I will continue today and discontinue tomorrow. Monitor for improvement in oxygenation.  Vocal cord dysfunction with stridor. Chronic history. Currently has received racemic epi. Will monitor for now. Outpatient follow-up with ENT recommended but unable to see them as of right now due to prolonged wait time. Will monitor.  GERD. Was supposed to be on Protonix.  Will resume.  Anxiety. Resume Xanax.

## 2023-09-13 NOTE — Evaluation (Signed)
Clinical/Bedside Swallow Evaluation Patient Details  Name: Debbie Stewart MRN: [id number] Date of Birth: 1996-09-02  Today's Date: 09/13/2023 Time: SLP Start Time (ACUTE ONLY): 0930 SLP Stop Time (ACUTE ONLY): 1000 SLP Time Calculation (min) (ACUTE ONLY): 30 min  Past Medical History:  Past Medical History:  Diagnosis Date   Anemia    Anxiety    Asthma    Depression    Fibroid    Hyperthyroidism    Ovarian cyst    Seizures (HCC)    psuedo seizures,  stress related   Past Surgical History:  Past Surgical History:  Procedure Laterality Date   DILATION AND CURETTAGE OF UTERUS     WISDOM TOOTH EXTRACTION     HPI:  Debbie Stewart is a 27 y.o. female with history of vocal cord dysfunction,  asthma and shellfish allergy who presented to the emergency department with difficulty breathing.  She tells me she was fine when she went to bed last night.  The shortness of breath started this morning at about 8:15.  She went to work anyway and continued to get worse at work.  Because the patient was in some respiratory distress at the time of my visit other history was deferred.  Per the emergency department provider the patient was initially treated for asthma exacerbation but after some time in the ER she started to feel like her throat was closing up despite the fact that she had already received several nebulizer treatments.  She was noted to have stridor and at that time she was given racemic epi, steroids, Pepcid,  IV Benadryl, and Ativan her symptoms are greatly improved. Debbie Stewart was transported to [facility] for admission.  On arrival to the floor the patient had an another episode of respiratory distress.  She complained of chest pain (which she says she associates with her asthma) and difficulty breathing.  She was immediately given racemic epi and albuterol neb treatment.  It appears that it was >8 hours since Benadryl and Solu-Medrol.  All of those medications were reordered and shortly there after  the patient was able to breathe better.  MBS completed 08/02/23 with normal oropharyngeal swallow, but Pill noted to lodge in upper esophagus. Liquid wash noted to move around pill, not sufficient for pushing through. Puree bolus is facilitative for moving pill through esophagus.  ST consulted for swallow evaluation.    Assessment / Plan / Recommendation  Clinical Impression  Pt seen for clinical swallowing evaluation with dysphonic vocal quality noted throughout evaluation.  Pt consumed ice chips, thin via cup with small sips, puree and solids with delayed cough noted intermittently during evaluation, but otherwise a normal oropharyngeal swallow.  MBS completed 08/02/23 with a normal oropharyngeal swallow observed, but pill was observed to become lodged in upper esophagus with puree bolus required to propel into esophagus for adequate clearance.  Pt appeared anxious and informed SLP of pmhx of GERD, anxiety, asthma and vocal cord dysfunction.  She also stated she experiences bloating in abdomen consistently, difficulty with regularity as she has bowel movements 1-2x week and intermittent globus sensation and pain mid-chest during meals.  Pt educated re: esophageal precautions and general swallow precautions with continuation of regular/thin liquids recommended.  Pt may benefit from full esophageal assessment d/t reported esophageal symptoms during intake.  Medications whole with liquid with puree bolus if pharyngeal clearance diminished. ST will f/u briefly for dysphagia tx/diet tolerance/esophageal education/strategies.  Thank you for this consult. SLP Visit Diagnosis: Dysphagia, unspecified (R13.10)    Aspiration  Risk  Mild aspiration risk    Diet Recommendation   Thin;Age appropriate regular  Medication Administration: Whole meds with liquid (may try puree bolus if difficulty noted with clearance)    Other  Recommendations Recommended Consults: Consider GI evaluation;Consider esophageal  assessment Oral Care Recommendations: Oral care BID;Patient independent with oral care    Recommendations for follow up therapy are one component of a multi-disciplinary discharge planning process, led by the attending physician.  Recommendations may be updated based on patient status, additional functional criteria and insurance authorization.  Follow up Recommendations Other (comment) (May consider outpatient SLP if dysphonia persists)      Assistance Recommended at Discharge  PRN  Functional Status Assessment Patient has had a recent decline in their functional status and demonstrates the ability to make significant improvements in function in a reasonable and predictable amount of time.  Frequency and Duration min 1 x/week  1 week       Prognosis Prognosis for improved oropharyngeal function: Good      Swallow Study   General Date of Onset: 09/13/23 HPI: Debbie Stewart is a 27 y.o. female with history of vocal cord dysfunction,  asthma and shellfish allergy who presented to the emergency department with difficulty breathing.  She tells me she was fine when she went to bed last night.  The shortness of breath started this morning at about 8:15.  She went to work anyway and continued to get worse at work.  Because the patient was in some respiratory distress at the time of my visit other history was deferred.  Per the emergency department provider the patient was initially treated for asthma exacerbation but after some time in the ER she started to feel like her throat was closing up despite the fact that she had already received several nebulizer treatments.  She was noted to have stridor and at that time she was given racemic epi, steroids, Pepcid,  IV Benadryl, and Ativan her symptoms are greatly improved. Debbie Stewart was transported to [facility] for admission.  On arrival to the floor the patient had an another episode of respiratory distress.  She complained of chest pain (which she says she  associates with her asthma) and difficulty breathing.  She was immediately given racemic epi and albuterol neb treatment.  It appears that it was >8 hours since Benadryl and Solu-Medrol.  All of those medications were reordered and shortly there after the patient was able to breathe better.  MBS completed 08/02/23 with normal oropharyngeal swallow, but Pill noted to lodge in upper esophagus. Liquid wash noted to move around pill, not sufficient for pushing through. Puree bolus is facilitative for moving pill through esophagus.  ST consulted for swallow evaluation. Type of Study: Bedside Swallow Evaluation Previous Swallow Assessment: MBS 08/02/23 with normal oropharyngeal swallow Pill noted to lodge in upper esophagus. Liquid wash noted to move around pill, not sufficient for pushing through. Puree bolus is facilitative for moving pill through esophagus Diet Prior to this Study: Regular;Thin liquids (Level 0) Temperature Spikes Noted: No Respiratory Status: Nasal cannula (2L) History of Recent Intubation: No Behavior/Cognition: Alert;Cooperative;Other (Comment) (anxious) Oral Cavity Assessment: Within Functional Limits Oral Care Completed by SLP: No (Pt performed prior) Oral Cavity - Dentition: Adequate natural dentition Vision: Functional for self-feeding Self-Feeding Abilities: Able to feed self Patient Positioning: Upright in bed Baseline Vocal Quality: Hoarse;Breathy;Low vocal intensity;Other (comment) (vocal cord dysfunction) Volitional Cough: Strong Volitional Swallow: Able to elicit    Oral/Motor/Sensory Function Overall Oral Motor/Sensory Function: Within  functional limits   Ice Chips Ice chips: Within functional limits Presentation: Spoon;Self Fed   Thin Liquid Thin Liquid: Impaired Presentation: Cup;Self Fed Pharyngeal  Phase Impairments: Cough - Delayed    Nectar Thick Nectar Thick Liquid: Not tested   Honey Thick Honey Thick Liquid: Not tested   Puree Puree: Within functional  limits Presentation: Self Fed   Solid     Solid: Within functional limits Presentation: Self Fed      Pat Tedi Hughson,M.S.,CCC-SLP 09/13/2023,10:25 AM

## 2023-09-13 NOTE — Progress Notes (Signed)
[facility] Progress Note Patient: Debbie Stewart BMW:[id number] DOB: 12/13/1995 DOA: 09/12/2023  DOS: the patient was seen and examined on 09/13/2023  Brief hospital course: PMH of vocal cord dysfunction and asthma presented to hospital with complaints of cough and shortness of breath. 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 Was recently seen in hospital by ENT and was referred to ENT outpatient but unfortunately has not had a chance to see them. Currently being treated for asthma exacerbation. I do not think that that she has an anaphylactic reaction.  Assessment and Plan: Asthma exacerbation. Oxygenation improving but still on 2 LPM. Continue albuterol scheduled. Continue steroid. Monitor for now.  Concern for anaphylactic reaction. Ruled out. Patient is on Benadryl which I will continue for today but will discontinue tomorrow. Also on Pepcid which I will continue today and discontinue tomorrow. Monitor for improvement in oxygenation.  Vocal cord dysfunction with stridor. Chronic history. Currently has received racemic epi. Will monitor for now. Outpatient follow-up with ENT recommended but unable to see them as of right now due to prolonged wait time. Will monitor.  GERD. Was supposed to be on Protonix.  Will resume.  Anxiety. Resume Xanax.   Subjective: Ongoing cough and shortness of breath.  No nausea no vomiting no fever no chills.  No angioedema reported by the patient.  No rash.  No hives.  Physical Exam: General: in increased distress, No Rash Cardiovascular: S1 and S2 Present, No Murmur Respiratory: Increased respiratory effort, Bilateral Air entry present. No Crackles, inspiratory wheezes Abdomen: Bowel Sound present, No tenderness Extremities: No edema Neuro: Alert and oriented x3, no new focal deficit  Data Reviewed: I have Reviewed nursing notes, Vitals, and Lab results. Since last encounter, pertinent lab  results CBC and BMP   . I have ordered test including CBC and BMP  .   Disposition: Status is: Inpatient Remains inpatient appropriate because: Monitor for improvement in oxygenation  SCDs Start: 09/12/23 2227   Family Communication: Family at bedside Level of care: Progressive   Vitals:   09/13/23 1431 09/13/23 1500 09/13/23 1600 09/13/23 1700  BP:  127/74 130/79 128/83  Pulse: 93 (!) 102 (!) 113 (!) 118  Resp: 19 (!) 21 (!) 24 (!) 31  Temp:   99.4 F (37.4 C)   TempSrc:   Oral   SpO2: 100% 99% 98% 99%  Weight:      Height:         Author: Lynden Oxford, MD 09/13/2023 5:41 PM  Please look on [email or web address] to find out who is on call.

## 2023-09-13 NOTE — Plan of Care (Signed)

## 2023-09-13 NOTE — Progress Notes (Signed)
Report called to Marline Backbone RN. All questions answered at this time. All patient belongings and paper chart transported with pt. Patient was transferred in the wheelchair by NT on RA.

## 2023-09-14 ENCOUNTER — Other Ambulatory Visit (HOSPITAL_BASED_OUTPATIENT_CLINIC_OR_DEPARTMENT_OTHER): Payer: Self-pay

## 2023-09-14 DIAGNOSIS — J4541 Moderate persistent asthma with (acute) exacerbation: Secondary | ICD-10-CM

## 2023-09-14 MED ORDER — HYDROXYZINE HCL 25 MG PO TABS: 25.0000 mg | ORAL_TABLET | Freq: Three times a day (TID) | ORAL | 0 refills | Status: AC | PRN

## 2023-09-14 MED ORDER — DEXTROMETHORPHAN POLISTIREX ER 30 MG/5ML PO SUER
60.0000 mg | Freq: Once | ORAL | Status: AC
Start: 2023-09-14 — End: 2023-09-14
  Administered 2023-09-14: 60 mg via ORAL
  Filled 2023-09-14: qty 10

## 2023-09-14 MED ORDER — PREDNISONE 10 MG PO TABS
ORAL_TABLET | ORAL | 0 refills | Status: AC
Start: 2023-09-14 — End: ?

## 2023-09-14 MED ORDER — BENZONATATE 100 MG PO CAPS: 100.0000 mg | ORAL_CAPSULE | Freq: Three times a day (TID) | ORAL | 0 refills | Status: AC

## 2023-09-14 MED ORDER — ALBUTEROL SULFATE (2.5 MG/3ML) 0.083% IN NEBU
2.5000 mg | INHALATION_SOLUTION | Freq: Once | RESPIRATORY_TRACT | Status: AC
  Administered 2023-09-14: 2.5 mg via RESPIRATORY_TRACT
  Filled 2023-09-14: qty 3

## 2023-09-14 MED ORDER — HYDROCODONE BIT-HOMATROP MBR 5-1.5 MG/5ML PO SOLN: 5.0000 mL | Freq: Four times a day (QID) | ORAL | 0 refills | Status: AC | PRN

## 2023-09-14 MED ORDER — MOMETASONE FURO-FORMOTEROL FUM 200-5 MCG/ACT IN AERO: 2.0000 | INHALATION_SPRAY | Freq: Two times a day (BID) | RESPIRATORY_TRACT | 0 refills | Status: AC

## 2023-09-14 MED ORDER — ALBUTEROL SULFATE (2.5 MG/3ML) 0.083% IN NEBU: INHALATION_SOLUTION | RESPIRATORY_TRACT | 0 refills | Status: AC

## 2023-09-14 MED ORDER — GUAIFENESIN ER 600 MG PO TB12: 600.0000 mg | ORAL_TABLET | Freq: Two times a day (BID) | ORAL | 0 refills | Status: AC

## 2023-09-14 NOTE — Plan of Care (Signed)
  Problem: Education: Goal: Knowledge of General Education information will improve Description: Including pain rating scale, medication(s)/side effects and non-pharmacologic comfort measures Outcome: Adequate for Discharge   Problem: Health Behavior/Discharge Planning: Goal: Ability to manage health-related needs will improve Outcome: Adequate for Discharge   Problem: Clinical Measurements: Goal: Ability to maintain clinical measurements within normal limits will improve Outcome: Adequate for Discharge Goal: Will remain free from infection Outcome: Adequate for Discharge Goal: Diagnostic test results will improve Outcome: Adequate for Discharge Goal: Respiratory complications will improve Outcome: Adequate for Discharge Goal: Cardiovascular complication will be avoided Outcome: Adequate for Discharge   Problem: Activity: Goal: Risk for activity intolerance will decrease Outcome: Adequate for Discharge   Problem: Nutrition: Goal: Adequate nutrition will be maintained Outcome: Adequate for Discharge   Problem: Coping: Goal: Level of anxiety will decrease Outcome: Adequate for Discharge   Problem: Pain Management: Goal: General experience of comfort will improve Outcome: Adequate for Discharge   Problem: Elimination: Goal: Will not experience complications related to bowel motility Outcome: Adequate for Discharge Goal: Will not experience complications related to urinary retention Outcome: Adequate for Discharge   Problem: Skin Integrity: Goal: Risk for impaired skin integrity will decrease Outcome: Adequate for Discharge

## 2023-09-14 NOTE — Progress Notes (Signed)
 AVS instructions were reviewed with patient, all questions were answered. All personal belongings were returned.

## 2023-09-14 NOTE — TOC Initial Note (Signed)
Transition of Care [facility]) - Initial/Assessment Note    Patient Details  Name: Debbie Stewart MRN: [id number] Date of Birth: [birth year]  [facility] CM/SW Contact:    Adrian Prows, RN Phone Number: 09/14/2023, 8:52 AM  Clinical Narrative:                 [facility] consulted for SDOH risks; spoke w/ pt in room; pt says she is from home; she plans to return at d/c; pt identified POC cousin Verl Dicker [phone]); she has transportation; pt denies SDOH risks; she does not have DME, HH services, or home oxygen; no TOC needs; please place consult if needed.  Expected Discharge Plan: Home/Self Care Barriers to Discharge: Continued Medical Work up   Patient Goals and CMS Choice Patient states their goals for this hospitalization and ongoing recovery are:: home          Expected Discharge Plan and Services   Discharge Planning Services: CM Consult   Living arrangements for the past 2 months: Apartment                                      Prior Living Arrangements/Services Living arrangements for the past 2 months: Apartment Lives with:: Self Patient language and need for interpreter reviewed:: Yes Do you feel safe going back to the place where you live?: Yes      Need for Family Participation in Patient Care: Yes (Comment) Care giver support system in place?: Yes (comment) Current home services:  (n/a) Criminal Activity/Legal Involvement Pertinent to Current Situation/Hospitalization: No - Comment as needed  Activities of Daily Living   ADL Screening (condition at time of admission) Independently performs ADLs?: Yes (appropriate for developmental age) Is the patient deaf or have difficulty hearing?: Yes Does the patient have difficulty seeing, even when wearing glasses/contacts?: Yes Does the patient have difficulty concentrating, remembering, or making decisions?: Yes  Permission Sought/Granted Permission sought to share information with :  Case Manager Permission granted to share information with : Yes, Verbal Permission Granted  Share Information with NAME: Case Manager     Permission granted to share info w Relationship: [name] (cousin) [phone]     Emotional Assessment Appearance:: Appears stated age Attitude/Demeanor/Rapport: Gracious Affect (typically observed): Accepting Orientation: : Oriented to Self, Oriented to Place, Oriented to  Time, Oriented to Situation Alcohol / Substance Use: Not Applicable Psych Involvement: No (comment)  Admission diagnosis:  Asthma attack [J45.901] Mild asthma with exacerbation, unspecified whether persistent [J45.901] Asthma exacerbation [J45.901] Patient Active Problem List   Diagnosis Date Noted   Asthma attack 09/12/2023   Dyspnea 08/03/2023   Chest tightness 08/03/2023   Gastroesophageal reflux disease without esophagitis 08/03/2023   Vocal fold dysfunction 08/03/2023   Asthma exacerbation 08/01/2023   Stridor 08/01/2023   PCP:  [facility] Pharmacy:   [id number] - [place], [place] - [address] [place] [address] Phone: [phone] Fax: [phone]  [facility] [address] [place] [address] Phone: [phone] Fax: [phone]  [facility] [address] [place] [address] Phone: [phone] Fax: [phone]     Social Determinants of Health (SDOH) Social History: SDOH Screenings   Food Insecurity: No Food Insecurity (09/14/2023)  Recent Concern: Food Insecurity - Food Insecurity Present (09/12/2023)  Housing: Low Risk  (09/14/2023)  Recent  Concern: Housing - Medium Risk (09/12/2023)  Transportation Needs: No Transportation Needs (09/14/2023)  Utilities: Not At Risk (09/14/2023)  Tobacco Use: Low Risk  (09/12/2023)   SDOH Interventions: Food Insecurity Interventions: Intervention  Not Indicated, Inpatient TOC Housing Interventions: Intervention Not Indicated, Inpatient TOC Transportation Interventions: Intervention Not Indicated, Inpatient TOC Utilities Interventions: Intervention Not Indicated, Inpatient TOC   Readmission Risk Interventions     No data to display

## 2023-09-14 NOTE — Plan of Care (Signed)

## 2023-09-17 NOTE — Discharge Summary (Signed)
Physician Discharge Summary   Patient: Debbie Stewart MRN: [id number] DOB: Jun 09, 1996  Admit date:     09/12/2023  Discharge date: 09/14/2023  Discharge Physician: Lynden Oxford  PCP: [facility]  Recommendations at discharge: Follow-up with PCP. Follow-up with ENT.  (Patient reportedly wanted to be seen only after January)   Follow-up Information     [facility]. Schedule an appointment as soon as possible for a visit in 1 week(s).   Contact information: [address] [place] [address] [phone]         [name], MD. Schedule an appointment as soon as possible for a visit in 1 month(s).   Specialty: Otolaryngology Contact information: [address] [place] [address] [phone]                Discharge Diagnoses: Principal Problem:   Asthma attack Active Problems:   Asthma exacerbation  Brief hospital course: PMH of vocal cord dysfunction and asthma presented to hospital with complaints of cough and shortness of breath. 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 Was recently seen in hospital by ENT and was referred to ENT outpatient but unfortunately has not had a chance to see them. Currently being treated for asthma exacerbation. I do not think that that she has an anaphylactic reaction.  Assessment and Plan: Asthma exacerbation.  Acute hypoxia.  Resolved. Required some oxygen at the time of admission. Continue albuterol scheduled. Continue steroid. Monitor for now.  Concern for anaphylactic reaction. Ruled out.  Vocal cord dysfunction with stridor. Chronic history. Currently has received racemic epi. Outpatient follow-up with ENT recommended but per patient she is unable to see them as of right now due to prolonged wait time. I discussed with Dr. Jearld Fenton.  GERD. Was supposed to be on Protonix.  Will resume.  Anxiety. Resume  Xanax.  Consultants:  None  Procedures performed:  None  DISCHARGE MEDICATION: Allergies as of 09/14/2023       Reactions   Dust Mite Extract Anaphylaxis   Shellfish Allergy Anaphylaxis        Medication List     STOP taking these medications    hydrocortisone 2.5 % cream       TAKE these medications    albuterol 108 (90 Base) MCG/ACT inhaler Commonly known as: VENTOLIN HFA Inhale 1 puff into the lungs every 6 (six) hours as needed for wheezing or shortness of breath. What changed: You were already taking a medication with the same name, and this prescription was added. Make sure you understand how and when to take each.   albuterol (2.5 MG/3ML) 0.083% nebulizer solution Commonly known as: PROVENTIL Take 3 mLs (2.5 mg total) by nebulization 4 (four) times daily for 7 days, THEN 3 mLs (2.5 mg total) every 4 (four) hours as needed for shortness of breath. Start taking on: September 14, 2023 What changed: See the new instructions.   ALPRAZolam 0.5 MG tablet Commonly known as: XANAX Take 1 tablet (0.5 mg total) by mouth 2 (two) times daily. What changed:  when to take this reasons to take this   Azelastine HCl 137 MCG/SPRAY Soln Place 1 spray into both nostrils every 4 (four) hours as needed (congestion).   benzonatate 100 MG capsule Commonly known as: TESSALON Take 1 capsule (100 mg total) by mouth 3 (three) times daily.   CHLOROPHYLL PO Take 2 drops by mouth 3 (three) times a week.  EPINEPHrine 0.3 mg/0.3 mL Soaj injection Commonly known as: EPI-PEN Inject 0.3 mg into the muscle as needed for anaphylaxis.   guaiFENesin 600 MG 12 hr tablet Commonly known as: MUCINEX Take 1 tablet (600 mg total) by mouth 2 (two) times daily.   HYDROcodone bit-homatropine 5-1.5 MG/5ML syrup Commonly known as: HYCODAN Take 5 mLs by mouth every 6 (six) hours as needed for cough.   hydrOXYzine 25 MG tablet Commonly known as: ATARAX Take 1 tablet (25 mg total) by mouth  every 8 (eight) hours as needed for anxiety. What changed: reasons to take this   ibuprofen 200 MG tablet Commonly known as: ADVIL Take 400 mg by mouth as needed for moderate pain (pain score 4-6) or mild pain (pain score 1-3).   MAGNESIUM PO Take 1 Scoop by mouth at bedtime.   mometasone-formoterol 200-5 MCG/ACT Aero Commonly known as: DULERA Inhale 2 puffs into the lungs 2 (two) times daily.   multivitamin with minerals tablet Take 1 tablet by mouth daily.   pantoprazole 40 MG tablet Commonly known as: PROTONIX Take 1 tablet (40 mg total) by mouth 2 (two) times daily before a meal. What changed:  when to take this reasons to take this   predniSONE 10 MG tablet Commonly known as: DELTASONE Take 50mg  daily for 3days,Take 40mg  daily for 3days,Take 30mg  daily for 3days,Take 20mg  daily for 3days,Take 10mg  daily for 3days, then stop   triamcinolone ointment 0.1 % Commonly known as: KENALOG Apply 1 Application topically 2 (two) times daily.       Disposition: Home Diet recommendation: Regular diet  Discharge Exam: Vitals:   09/14/23 0741 09/14/23 0816 09/14/23 1118 09/14/23 1358  BP:  (!) 109/59  124/75  Pulse:  80  94  Resp:  16  18  Temp:  98.5 F (36.9 C)  98.5 F (36.9 C)  TempSrc:  Oral  Oral  SpO2: 98% 97% 95% 100%  Weight:      Height:       General: Appear in no distress; no visible Abnormal Neck Mass Or lumps, Conjunctiva normal Cardiovascular: S1 and S2 Present, no Murmur, Respiratory: good respiratory effort, Bilateral Air entry present and CTA, no Crackles, no wheezes Abdomen: Bowel Sound present, Non tender  Extremities: no Pedal edema Neurology: alert and oriented to time, place, and person  Filed Weights   09/12/23 1035  Weight: 70.3 kg   Condition at discharge: stable  The results of significant diagnostics from this hospitalization (including imaging, microbiology, ancillary and laboratory) are listed below for reference.   Imaging  Studies: DG Chest Port 1 View  Result Date: 09/12/2023 CLINICAL DATA:  Asthma exacerbation, shortness of breath EXAM: PORTABLE CHEST 1 VIEW COMPARISON:  08/02/2023 FINDINGS: Cardiac and mediastinal contours are within normal limits. No focal pulmonary opacity. No pleural effusion or pneumothorax. No acute osseous abnormality. IMPRESSION: No acute cardiopulmonary process. Electronically Signed   By: Wiliam Ke M.D.   On: 09/12/2023 13:19    Microbiology: Results for orders placed or performed during the hospital encounter of 09/12/23  Resp panel by RT-PCR (RSV, Flu A&B, Covid) Anterior Nasal Swab     Status: None   Collection Time: 09/12/23  1:43 PM   Specimen: Anterior Nasal Swab  Result Value Ref Range Status   SARS Coronavirus 2 by RT PCR NEGATIVE NEGATIVE Final    Comment: (NOTE) SARS-CoV-2 target nucleic acids are NOT DETECTED.  The SARS-CoV-2 RNA is generally detectable in upper respiratory specimens during the acute phase of infection.  The lowest concentration of SARS-CoV-2 viral copies this assay can detect is 138 copies/mL. A negative result does not preclude SARS-Cov-2 infection and should not be used as the sole basis for treatment or other patient management decisions. A negative result may occur with  improper specimen collection/handling, submission of specimen other than nasopharyngeal swab, presence of viral mutation(s) within the areas targeted by this assay, and inadequate number of viral copies(<138 copies/mL). A negative result must be combined with clinical observations, patient history, and epidemiological information. The expected result is Negative.  Fact Sheet for Patients:  BloggerCourse.com  Fact Sheet for Healthcare Providers:  SeriousBroker.it  This test is no t yet approved or cleared by the [place] FDA and  has been authorized for detection and/or diagnosis of SARS-CoV-2 by FDA under an  Emergency Use Authorization (EUA). This EUA will remain  in effect (meaning this test can be used) for the duration of the COVID-19 declaration under Section 564(b)(1) of the Act, 21 U.S.C.section 360bbb-3(b)(1), unless the authorization is terminated  or revoked sooner.       Influenza A by PCR NEGATIVE NEGATIVE Final   Influenza B by PCR NEGATIVE NEGATIVE Final    Comment: (NOTE) The Xpert Xpress SARS-CoV-2/FLU/RSV plus assay is intended as an aid in the diagnosis of influenza from Nasopharyngeal swab specimens and should not be used as a sole basis for treatment. Nasal washings and aspirates are unacceptable for Xpert Xpress SARS-CoV-2/FLU/RSV testing.  Fact Sheet for Patients: BloggerCourse.com  Fact Sheet for Healthcare Providers: SeriousBroker.it  This test is not yet approved or cleared by the [place] FDA and has been authorized for detection and/or diagnosis of SARS-CoV-2 by FDA under an Emergency Use Authorization (EUA). This EUA will remain in effect (meaning this test can be used) for the duration of the COVID-19 declaration under Section 564(b)(1) of the Act, 21 U.S.C. section 360bbb-3(b)(1), unless the authorization is terminated or revoked.     Resp Syncytial Virus by PCR NEGATIVE NEGATIVE Final    Comment: (NOTE) Fact Sheet for Patients: BloggerCourse.com  Fact Sheet for Healthcare Providers: SeriousBroker.it  This test is not yet approved or cleared by the [place] FDA and has been authorized for detection and/or diagnosis of SARS-CoV-2 by FDA under an Emergency Use Authorization (EUA). This EUA will remain in effect (meaning this test can be used) for the duration of the COVID-19 declaration under Section 564(b)(1) of the Act, 21 U.S.C. section 360bbb-3(b)(1), unless the authorization is terminated or revoked.  Performed at [facility], [address], [place], [place] [address]   MRSA Next Gen by PCR, Nasal     Status: None   Collection Time: 09/12/23 10:00 PM   Specimen: Nasal Mucosa; Nasal Swab  Result Value Ref Range Status   MRSA by PCR Next Gen NOT DETECTED NOT DETECTED Final    Comment: (NOTE) The GeneXpert MRSA Assay (FDA approved for NASAL specimens only), is one component of a comprehensive MRSA colonization surveillance program. It is not intended to diagnose MRSA infection nor to guide or monitor treatment for MRSA infections. Test performance is not FDA approved in patients less than 36 years old. Performed at [facility], [address] [place], [place] [address]    Labs: CBC: Recent Labs  Lab 09/12/23 1040 09/12/23 1409 09/13/23 0254  WBC 8.9  --  20.7*  NEUTROABS 6.5  --   --   HGB 10.6* 11.2* 8.7*  HCT 35.1* 33.0* 28.5*  MCV 81.8  --  82.6  PLT 443*  --  389   Basic Metabolic Panel: Recent Labs  Lab 09/12/23 1040 09/12/23 1409 09/13/23 0254  NA 141 138 137  K 3.3* 3.0* 3.9  CL 105  --  108  CO2 25  --  19*  GLUCOSE 87  --  157*  BUN 8  --  6  CREATININE 0.78  --  0.68  CALCIUM 9.8  --  9.4  MG  --   --  2.1   Liver Function Tests: No results for input(s): "AST", "ALT", "ALKPHOS", "BILITOT", "PROT", "ALBUMIN" in the last 168 hours. CBG: No results for input(s): "GLUCAP" in the last 168 hours.  Discharge time spent: greater than 30 minutes.  Author: Lynden Oxford, MD  [facility] 09/14/2023

## 2023-09-24 ENCOUNTER — Other Ambulatory Visit (HOSPITAL_BASED_OUTPATIENT_CLINIC_OR_DEPARTMENT_OTHER): Payer: Self-pay

## 2023-11-27 ENCOUNTER — Other Ambulatory Visit (HOSPITAL_COMMUNITY): Payer: Self-pay

## 2023-12-19 IMAGING — CT CT ABD-PELV W/ CM
2 of 3 series · 16 of 46 positions shown, 18 images · IV contrast (agent unspecified)
Comparison: 07/27/2021

CLINICAL DATA: 26-year-old female with acute RIGHT abdominal and
pelvic pain.

EXAM:
CT ABDOMEN AND PELVIS WITH CONTRAST
TECHNIQUE: Multidetector CT imaging of the abdomen and pelvis was performed
using the standard protocol following bolus administration of
intravenous contrast.

[Series 2: axial st · axial · 0.98mm/px · z∈[+1072,+1527]mm · 13 of 105 slices shown, 15 images]
[im 7/105  soft-tissue]
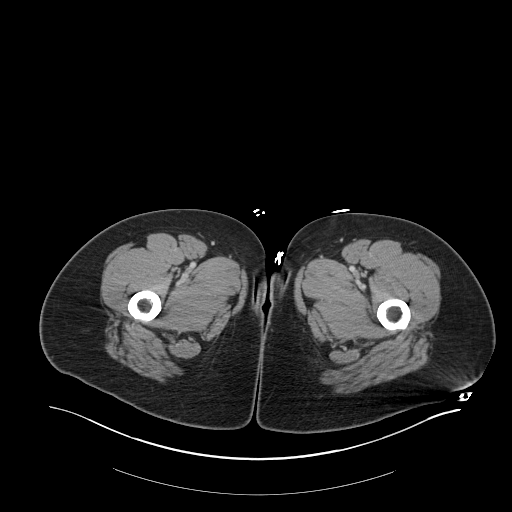
[im 7/105  bone]
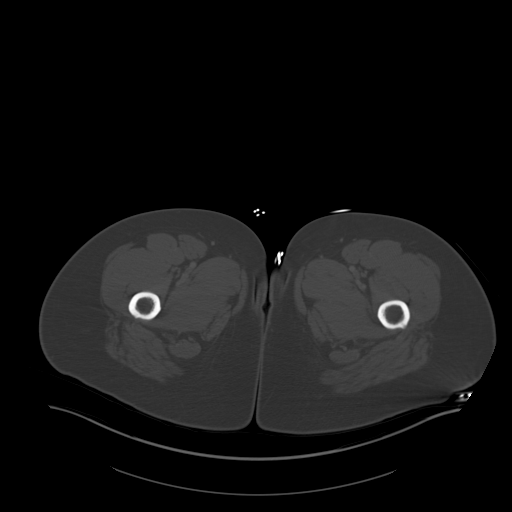
[im 14/105  soft-tissue]
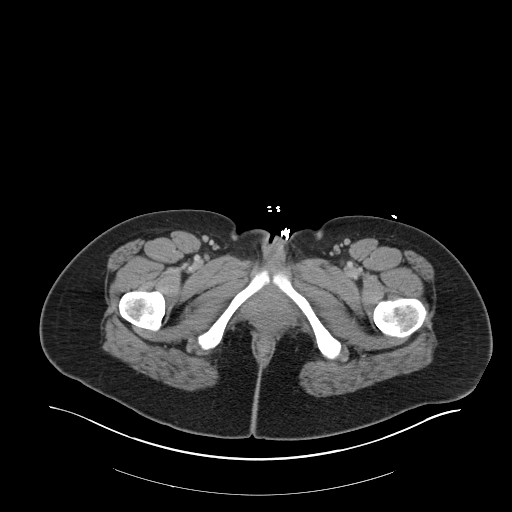
[im 21/105  soft-tissue]
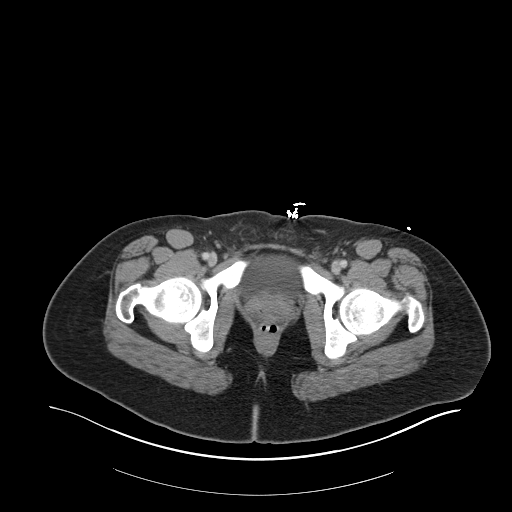
[im 31/105  soft-tissue]
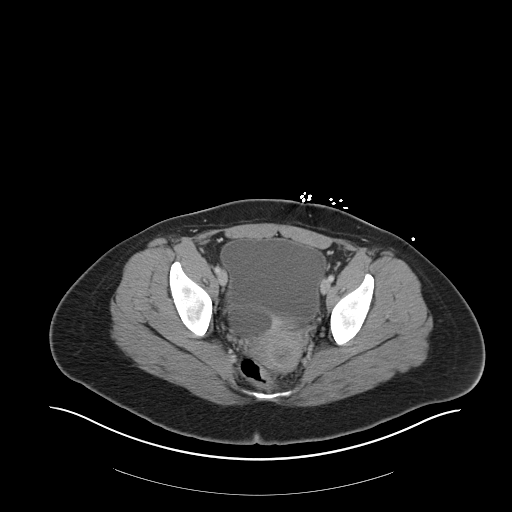
[im 37/105  soft-tissue]
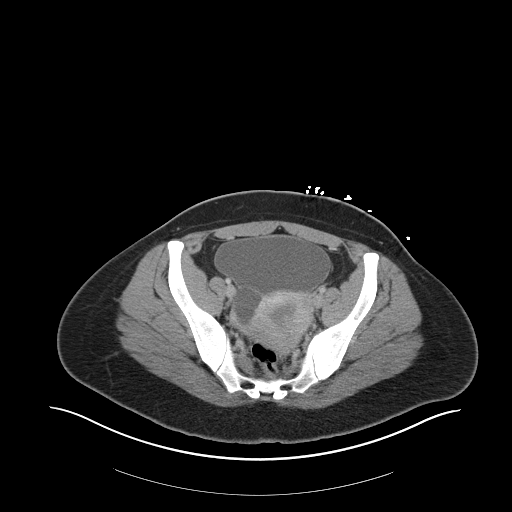
[im 44/105  soft-tissue]
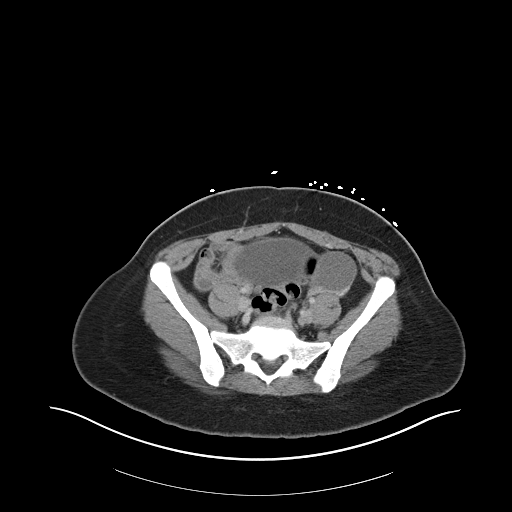
[im 54/105  soft-tissue]
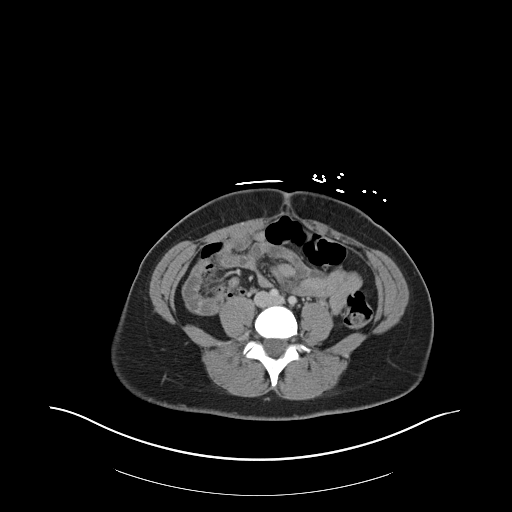
[im 61/105  soft-tissue]
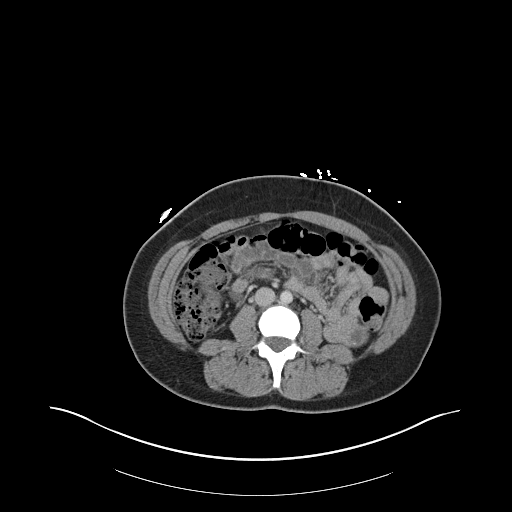
[im 68/105  soft-tissue]
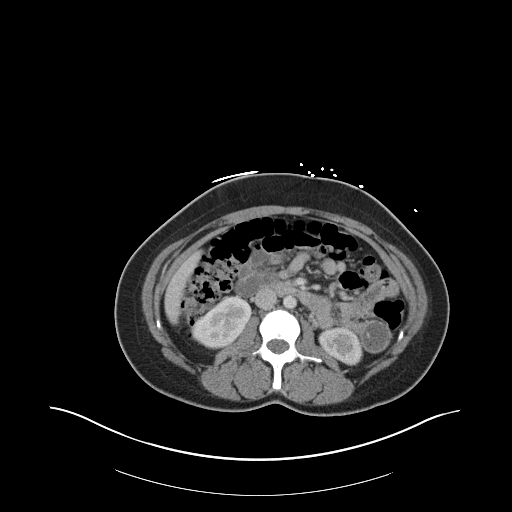
[im 68/105  bone]
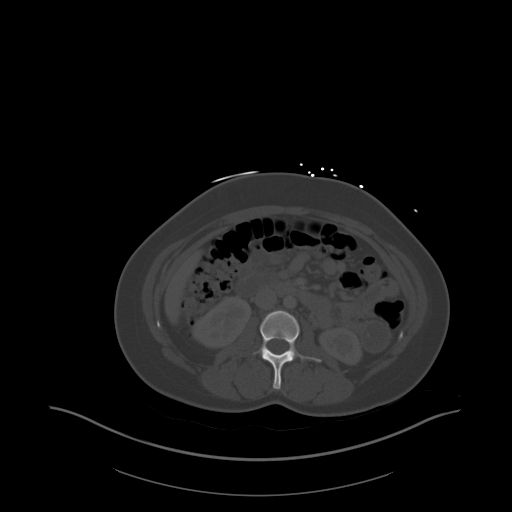
[im 74/105  soft-tissue]
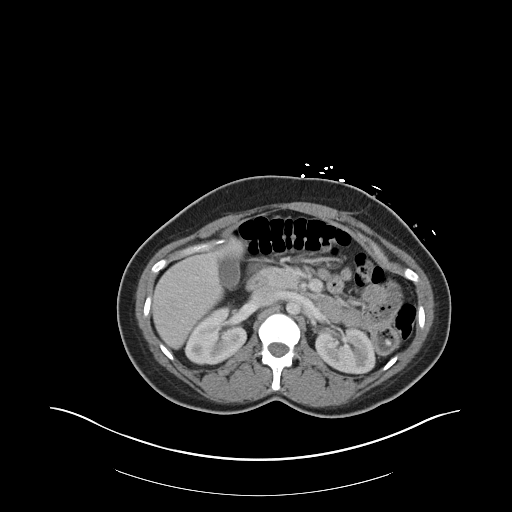
[im 84/105  soft-tissue]
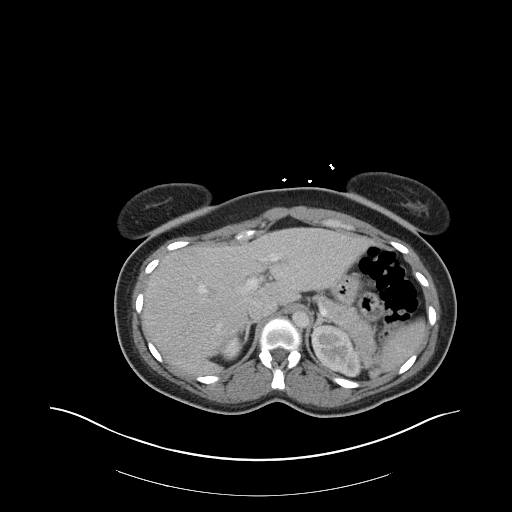
[im 91/105  soft-tissue]
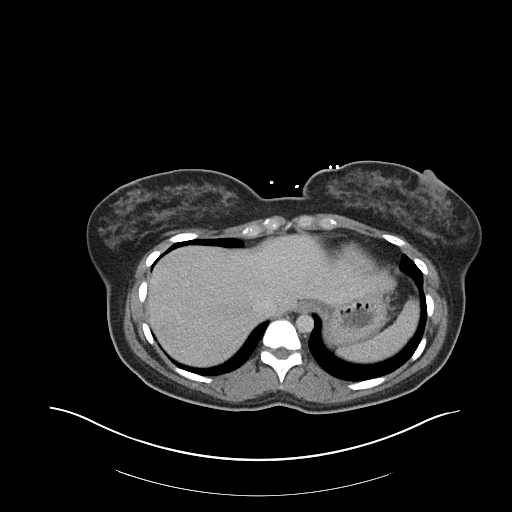
[im 98/105  soft-tissue]
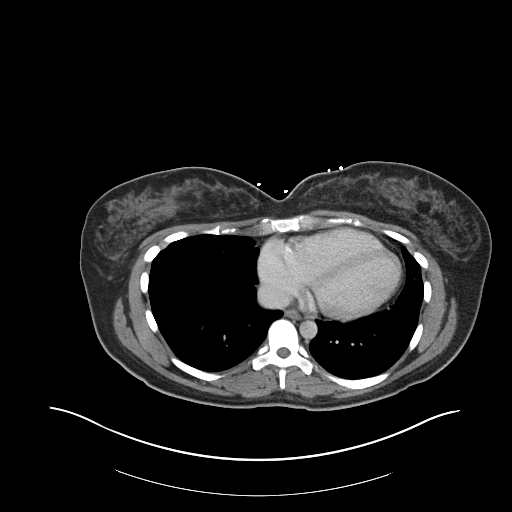

[Series 4: coronal st · coronal · 0.88mm/px · 3 of 143 slices shown]
[im 48/143  soft-tissue]
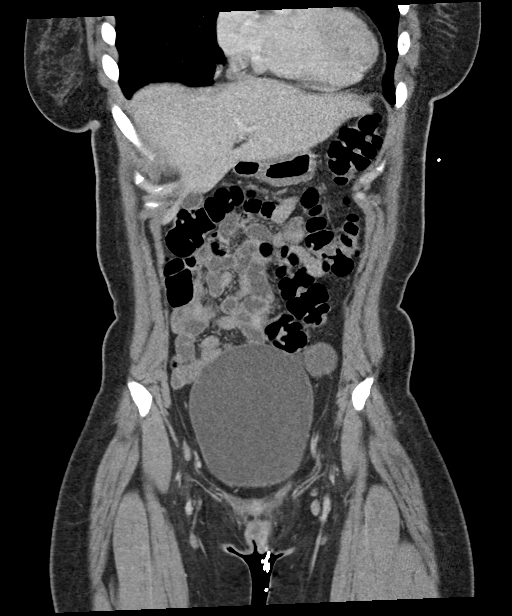
[im 64/143  soft-tissue]
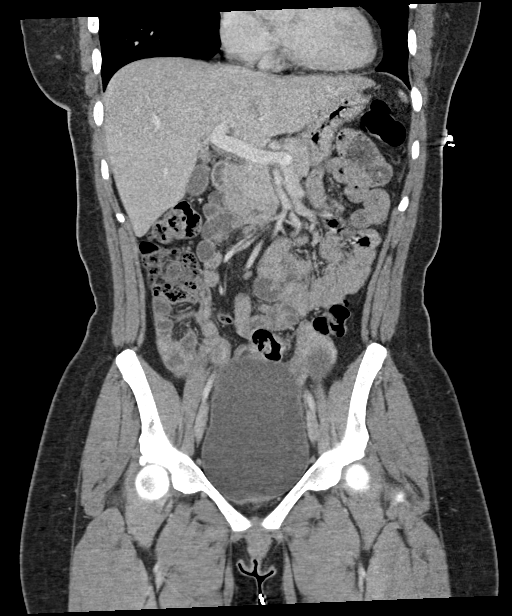
[im 79/143  soft-tissue]
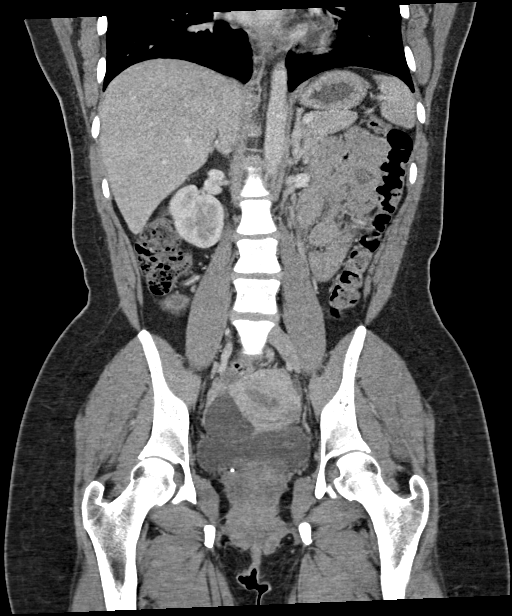

[16 of 46 positions shown; findings below may reference images not displayed]

RADIATION DOSE REDUCTION: This exam was performed according to the
departmental dose-optimization program which includes automated
exposure control, adjustment of the mA and/or kV according to
patient size and/or use of iterative reconstruction technique.

CONTRAST:  100mL OMNIPAQUE IOHEXOL 300 MG/ML  SOLN
FINDINGS: Lower chest: No acute abnormality.

Hepatobiliary: The liver and gallbladder are unremarkable. There is
no evidence of intrahepatic or extrahepatic biliary dilatation.

Pancreas: Unremarkable

Spleen: Unremarkable

Adrenals/Urinary Tract: The kidneys, adrenal glands and bladder are
unremarkable.

Stomach/Bowel: There is a mildly dilated loop of small bowel
containing food material within the LEFT abdomen without definite
bowel wall thickening or inflammatory changes. The remainder of the
bowel is unremarkable.

The stomach is within normal limits. Appendix appears normal.

No bowel wall thickening identified.

Vascular/Lymphatic: No significant vascular findings are present. No
enlarged abdominal or pelvic lymph nodes.

Reproductive: A 3.5 x 4.4 cm simple appearing RIGHT ovarian cyst and
a 3.8 x 4.2 cm simple appearing LEFT ovarian cysts noted without
adjacent inflammation or fluid.

The uterus is unremarkable.

Other: No ascites, focal collection or pneumoperitoneum.

Musculoskeletal: No acute or suspicious bony abnormalities are
noted.
IMPRESSION: 1. Mildly dilated loop of small bowel containing food material
within the LEFT abdomen without definite bowel wall thickening or
inflammatory changes. This is nonspecific but may represent a focal
ileus. No other bowel dilatation or bowel abnormalities identified.
2. Bilateral simple appearing ovarian cysts, measuring up to 4.4 cm
on the RIGHT and 3.8 cm on the LEFT. No adjacent inflammation or
fluid. No follow-up imaging recommended. Note: This recommendation
does not apply to premenarchal patients and to those with increased
risk (genetic, family history, elevated tumor markers or other
high-risk factors) of ovarian cancer. Reference: JACR [DATE]. No other significant abnormalities.

## 2024-01-18 ENCOUNTER — Other Ambulatory Visit: Payer: Self-pay

## 2024-01-18 DIAGNOSIS — N83291 Other ovarian cyst, right side: Secondary | ICD-10-CM | POA: Diagnosis not present

## 2024-01-18 DIAGNOSIS — E876 Hypokalemia: Secondary | ICD-10-CM | POA: Insufficient documentation

## 2024-01-18 DIAGNOSIS — J45909 Unspecified asthma, uncomplicated: Secondary | ICD-10-CM | POA: Diagnosis not present

## 2024-01-18 DIAGNOSIS — K8050 Calculus of bile duct without cholangitis or cholecystitis without obstruction: Secondary | ICD-10-CM | POA: Insufficient documentation

## 2024-01-18 DIAGNOSIS — R1084 Generalized abdominal pain: Secondary | ICD-10-CM | POA: Diagnosis present

## 2024-01-18 MED ORDER — ONDANSETRON 4 MG PO TBDP: 4.0000 mg | ORAL_TABLET | Freq: Once | ORAL | Status: DC | PRN

## 2024-01-18 NOTE — ED Triage Notes (Signed)
 Pt took 2000mg  of tylenol for migraine at 2230. Since then pt has has severe LLQ and RLQ pain, NVD. Pt having tightness in central chest does not radiate. Denies urinary symptoms.

## 2024-01-19 ENCOUNTER — Emergency Department (HOSPITAL_BASED_OUTPATIENT_CLINIC_OR_DEPARTMENT_OTHER)
Admission: EM | Admit: 2024-01-19 | Discharge: 2024-01-19 | Disposition: A | Discharge status: Home / Self Care | Attending: Emergency Medicine | Admitting: Emergency Medicine

## 2024-01-19 ENCOUNTER — Emergency Department (HOSPITAL_BASED_OUTPATIENT_CLINIC_OR_DEPARTMENT_OTHER)

## 2024-01-19 DIAGNOSIS — E876 Hypokalemia: Secondary | ICD-10-CM

## 2024-01-19 DIAGNOSIS — R112 Nausea with vomiting, unspecified: Secondary | ICD-10-CM

## 2024-01-19 DIAGNOSIS — K8080 Other cholelithiasis without obstruction: Secondary | ICD-10-CM

## 2024-01-19 DIAGNOSIS — R1084 Generalized abdominal pain: Secondary | ICD-10-CM

## 2024-01-19 DIAGNOSIS — N83201 Unspecified ovarian cyst, right side: Secondary | ICD-10-CM

## 2024-01-19 LAB — URINALYSIS, ROUTINE W REFLEX MICROSCOPIC
Bilirubin Urine: NEGATIVE
Glucose, UA: NEGATIVE mg/dL
Hgb urine dipstick: NEGATIVE
Ketones, ur: 40 mg/dL — AB
Leukocytes,Ua: NEGATIVE
Nitrite: NEGATIVE
Protein, ur: NEGATIVE mg/dL
Specific Gravity, Urine: >=1.03 (ref 1.005–1.030)
pH: 5.5 (ref 5.0–8.0)

## 2024-01-19 LAB — COMPREHENSIVE METABOLIC PANEL
ALT: 19 U/L (ref 0–44)
AST: 25 U/L (ref 15–41)
Albumin: 4.6 g/dL (ref 3.5–5.0)
Alkaline Phosphatase: 45 U/L (ref 38–126)
Anion gap: 11 (ref 5–15)
BUN: 9 mg/dL (ref 6–20)
CO2: 20 mmol/L — ABNORMAL LOW (ref 22–32)
Calcium: 9.4 mg/dL (ref 8.9–10.3)
Chloride: 104 mmol/L (ref 98–111)
Creatinine, Ser: 0.79 mg/dL (ref 0.44–1.00)
GFR, Estimated: >60 mL/min (ref >60)
Glucose, Bld: 100 mg/dL — ABNORMAL HIGH (ref 70–99)
Potassium: 3.2 mmol/L — ABNORMAL LOW (ref 3.5–5.1)
Sodium: 135 mmol/L (ref 135–145)
Total Bilirubin: 1 mg/dL (ref 0.0–1.2)
Total Protein: 7.9 g/dL (ref 6.5–8.1)

## 2024-01-19 LAB — RESP PANEL BY RT-PCR (RSV, FLU A&B, COVID)  RVPGX2
Influenza A by PCR: NEGATIVE
Influenza B by PCR: NEGATIVE
Resp Syncytial Virus by PCR: NEGATIVE
SARS Coronavirus 2 by RT PCR: NEGATIVE

## 2024-01-19 LAB — LIPASE, BLOOD: Lipase: 33 U/L (ref 11–51)

## 2024-01-19 LAB — CBC
HCT: 33.3 % — ABNORMAL LOW (ref 36.0–46.0)
Hemoglobin: 10.4 g/dL — ABNORMAL LOW (ref 12.0–15.0)
MCH: 24.6 pg — ABNORMAL LOW (ref 26.0–34.0)
MCHC: 31.2 g/dL (ref 30.0–36.0)
MCV: 78.7 fL — ABNORMAL LOW (ref 80.0–100.0)
Platelets: 492 10*3/uL — ABNORMAL HIGH (ref 150–400)
RBC: 4.23 MIL/uL (ref 3.87–5.11)
RDW: 16.4 % — ABNORMAL HIGH (ref 11.5–15.5)
WBC: 11.5 10*3/uL — ABNORMAL HIGH (ref 4.0–10.5)
nRBC: 0 % (ref 0.0–0.2)

## 2024-01-19 LAB — C DIFFICILE QUICK SCREEN W PCR REFLEX
C Diff antigen: NEGATIVE
C Diff interpretation: NOT DETECTED
C Diff toxin: NEGATIVE

## 2024-01-19 LAB — PREGNANCY, URINE: Preg Test, Ur: NEGATIVE

## 2024-01-19 MED ORDER — IOHEXOL 300 MG/ML  SOLN
100.0000 mL | Freq: Once | INTRAMUSCULAR | Status: AC | PRN
  Administered 2024-01-19: 100 mL via INTRAVENOUS

## 2024-01-19 MED ORDER — DICYCLOMINE HCL 10 MG PO CAPS
10.0000 mg | ORAL_CAPSULE | Freq: Once | ORAL | Status: AC
  Administered 2024-01-19: 10 mg via ORAL
  Filled 2024-01-19: qty 1

## 2024-01-19 MED ORDER — KETOROLAC TROMETHAMINE 15 MG/ML IJ SOLN
15.0000 mg | Freq: Once | INTRAMUSCULAR | Status: AC
  Administered 2024-01-19: 15 mg via INTRAVENOUS
  Filled 2024-01-19: qty 1

## 2024-01-19 MED ORDER — DIPHENHYDRAMINE HCL 50 MG/ML IJ SOLN
25.0000 mg | Freq: Once | INTRAMUSCULAR | Status: AC
  Administered 2024-01-19: 25 mg via INTRAVENOUS
  Filled 2024-01-19: qty 1

## 2024-01-19 MED ORDER — SODIUM CHLORIDE 0.9 % IV BOLUS
1000.0000 mL | Freq: Once | INTRAVENOUS | Status: AC
  Administered 2024-01-19: 1000 mL via INTRAVENOUS

## 2024-01-19 MED ORDER — POTASSIUM CHLORIDE CRYS ER 20 MEQ PO TBCR: 40.0000 meq | EXTENDED_RELEASE_TABLET | Freq: Every day | ORAL | 0 refills | Status: AC

## 2024-01-19 MED ORDER — PROCHLORPERAZINE EDISYLATE 10 MG/2ML IJ SOLN
5.0000 mg | Freq: Once | INTRAMUSCULAR | Status: AC
  Administered 2024-01-19: 5 mg via INTRAVENOUS
  Filled 2024-01-19: qty 2

## 2024-01-19 MED ORDER — SUCRALFATE 1 G PO TABS
1.0000 g | ORAL_TABLET | Freq: Once | ORAL | Status: AC
  Administered 2024-01-19: 1 g via ORAL
  Filled 2024-01-19: qty 1

## 2024-01-19 MED ORDER — ONDANSETRON HCL 4 MG PO TABS: 4.0000 mg | ORAL_TABLET | ORAL | 0 refills | Status: AC | PRN

## 2024-01-19 MED ORDER — SUCRALFATE 1 G PO TABS: 1.0000 g | ORAL_TABLET | Freq: Three times a day (TID) | ORAL | 0 refills | Status: AC

## 2024-01-19 MED ORDER — ONDANSETRON HCL 4 MG/2ML IJ SOLN
4.0000 mg | Freq: Once | INTRAMUSCULAR | Status: AC
  Administered 2024-01-19: 4 mg via INTRAVENOUS
  Filled 2024-01-19: qty 2

## 2024-01-19 MED ORDER — MORPHINE SULFATE (PF) 4 MG/ML IV SOLN
4.0000 mg | Freq: Once | INTRAVENOUS | Status: AC
  Administered 2024-01-19: 4 mg via INTRAVENOUS
  Filled 2024-01-19: qty 1

## 2024-01-19 NOTE — ED Notes (Signed)
 Patient able to tolerate PO post compazine administration.

## 2024-01-19 NOTE — ED Notes (Signed)
 Pt is having dry heaves and dizziness

## 2024-01-19 NOTE — ED Provider Notes (Signed)
 [place] [facility] AT [facility] Provider Note  CSN: [id number] Arrival date & time: 01/18/24 2306  Chief Complaint(s) Abdominal Pain  HPI Debbie Stewart is a 28 y.o. female with past medical history as below, significant for anemia, anxiety, asthma, depression, fibroids, ovarian cyst who presents to the ED with complaint of abdominal pain, nausea vomiting diarrhea  Patient reports onset of symptoms yesterday morning.  Having abdominal cramping, nausea vomiting diarrhea.  Reduced p.o. intake secondary to nausea.  No BRBPR or melena.  No fevers.  No sick contacts recent travel.  Also had a headache that she feels typical of her migraine headaches for which she took Tylenol earlier today, headache has improved since the onset.  She is having lower abdominal pain, cramping, constant, worsened right before she has a bowel movement.  Denies prior abdominal surgeries.  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 Authorizing Provider  ondansetron (ZOFRAN) 4 MG tablet Take 1 tablet (4 mg total) by mouth every 4 (four) hours as needed for nausea or vomiting. 01/19/24  Yes Tanda Rockers A, DO  potassium chloride SA (KLOR-CON M) 20 MEQ tablet Take 2 tablets (40 mEq total) by mouth daily for 1 day. 01/19/24 01/20/24 Yes Tanda Rockers A, DO  sucralfate (CARAFATE) 1 g tablet Take 1 tablet (1 g total) by mouth with breakfast, with lunch, and  with evening meal for 7 days. 01/19/24 01/26/24 Yes Tanda Rockers A, DO  albuterol (PROVENTIL) (2.5 MG/3ML) 0.083% nebulizer solution Take 3 mLs (2.5 mg total) by nebulization 4 (four) times daily for 7 days, THEN 3 mLs (2.5 mg total) every 4 (four) hours as needed for shortness of breath. 09/14/23 10/21/23  Rolly Salter, MD  albuterol (VENTOLIN HFA) 108 (90 Base) MCG/ACT inhaler Inhale 1 puff into the lungs every 6 (six) hours as needed for wheezing or shortness of breath. 09/12/23   Gareth Eagle, PA-C  ALPRAZolam Prudy Feeler) 0.5 MG tablet Take 1 tablet (0.5 mg total) by mouth 2 (two) times daily. Patient taking differently: Take 0.5 mg by mouth as needed for anxiety. 08/03/23   Elgergawy, Leana Roe, MD  Azelastine HCl 137 MCG/SPRAY SOLN Place 1 spray into both nostrils every 4 (four) hours as needed (congestion). 08/30/23   [provider]  benzonatate (TESSALON) 100 MG capsule Take 1 capsule (100 mg total) by mouth 3 (three) times daily. 09/14/23   Rolly Salter, MD  CHLOROPHYLL PO Take 2 drops by mouth 3 (three) times a week.    [provider]  EPINEPHrine 0.3 mg/0.3 mL IJ SOAJ injection Inject 0.3 mg into the muscle as needed for anaphylaxis. Patient not taking: Reported on 08/01/2023 07/25/23   Charlynne Pander, MD  guaiFENesin (MUCINEX) 600 MG 12 hr tablet Take 1 tablet (600 mg total) by mouth 2 (two) times daily. 09/14/23   Rolly Salter, MD  HYDROcodone bit-homatropine (HYCODAN) 5-1.5 MG/5ML syrup Take 5 mLs by mouth every 6 (six) hours as needed for cough. 09/14/23   Rolly Salter, MD  hydrOXYzine (ATARAX) 25 MG tablet Take 1 tablet (25 mg total) by mouth every 8 (eight) hours as needed for anxiety. 09/14/23   Rolly Salter, MD  ibuprofen (ADVIL) 200 MG tablet Take 400 mg by mouth as needed for moderate pain (pain score 4-6) or mild pain (pain score 1-3).    [provider]  MAGNESIUM PO Take 1 Scoop by mouth at bedtime.    [provider]   mometasone-formoterol (DULERA) 200-5 MCG/ACT AERO Inhale 2 puffs into the lungs 2 (two) times daily. 09/14/23   Rolly Salter, MD  Multiple Vitamins-Minerals (MULTIVITAMIN WITH MINERALS) tablet Take 1 tablet by mouth daily.    [provider]  pantoprazole (PROTONIX) 40 MG tablet Take 1 tablet (40 mg total) by mouth 2 (two) times daily before a meal. Patient taking differently: Take 40 mg by mouth daily as needed (reflux). 08/03/23   Elgergawy, Leana Roe, MD  predniSONE (DELTASONE) 10 MG tablet Take 50mg  daily for 3days,Take 40mg  daily for 3days,Take 30mg  daily for 3days,Take 20mg  daily for 3days,Take 10mg  daily for 3days, then stop 09/14/23   Rolly Salter, MD  triamcinolone ointment (KENALOG) 0.1 % Apply 1 Application topically 2 (two) times daily. Patient not taking: Reported on 09/13/2023 08/30/23   [provider]                                                                                                                                    Past Surgical History Past Surgical History:  Procedure Laterality Date   DILATION AND CURETTAGE OF UTERUS     WISDOM TOOTH EXTRACTION     Family History Family History  Problem Relation Age of Onset   Hypertension Mother    Sickle cell trait Mother    Liver disease Mother    Healthy Father     Social History Social History   Tobacco Use   Smoking status: Never   Smokeless tobacco: Never  Vaping Use   Vaping status: Never Used  Substance Use Topics   Alcohol use: Not Currently    Alcohol/week: 2.0 standard drinks of alcohol    Types: 2 Glasses of wine per week   Drug use: Not Currently    Types: Marijuana    Comment: July   Allergies Dust mite extract and Shellfish allergy  Review of Systems A thorough review of systems was obtained and all systems are negative except as noted in the HPI and PMH.   Physical Exam Vital Signs  I have reviewed the triage vital signs BP 116/72 (BP Location: Right Arm)    Pulse 80   Temp 97.8 F (36.6 C) (Oral)   Resp 19   Ht 5\' 6"  (1.676 m)   Wt 74.8 kg   LMP 01/07/2024  SpO2 99%   BMI 26.63 kg/m  Physical Exam Vitals and nursing note reviewed.  Constitutional:      General: She is not in acute distress.    Appearance: Normal appearance.  HENT:     Head: Normocephalic and atraumatic.     Right Ear: External ear normal.     Left Ear: External ear normal.     Nose: Nose normal.     Mouth/Throat:     Mouth: Mucous membranes are moist.  Eyes:     General: No scleral icterus.       Right eye: No discharge.        Left eye: No discharge.  Cardiovascular:     Rate and Rhythm: Normal rate and regular rhythm.     Pulses: Normal pulses.     Heart sounds: Normal heart sounds.  Pulmonary:     Effort: Pulmonary effort is normal. No respiratory distress.     Breath sounds: Normal breath sounds. No stridor.  Abdominal:     General: Abdomen is flat. There is no distension.     Palpations: Abdomen is soft.     Tenderness: There is abdominal tenderness in the right lower quadrant, suprapubic area and left lower quadrant. There is no guarding or rebound.  Musculoskeletal:     Cervical back: No rigidity.     Right lower leg: No edema.     Left lower leg: No edema.  Skin:    General: Skin is warm and dry.     Capillary Refill: Capillary refill takes less than 2 seconds.  Neurological:     Mental Status: She is alert.  Psychiatric:        Mood and Affect: Mood normal.        Behavior: Behavior normal. Behavior is cooperative.     ED Results and Treatments Labs (all labs ordered are listed, but only abnormal results are displayed) Labs Reviewed  COMPREHENSIVE METABOLIC PANEL - Abnormal; Notable for the following components:      Result Value   Potassium 3.2 (*)    CO2 20 (*)    Glucose, Bld 100 (*)    All other components within normal limits  CBC - Abnormal; Notable for the following components:   WBC 11.5 (*)    Hemoglobin 10.4 (*)    HCT  33.3 (*)    MCV 78.7 (*)    MCH 24.6 (*)    RDW 16.4 (*)    Platelets 492 (*)    All other components within normal limits  URINALYSIS, ROUTINE W REFLEX MICROSCOPIC - Abnormal; Notable for the following components:   Ketones, ur 40 (*)    All other components within normal limits  RESP PANEL BY RT-PCR (RSV, FLU A&B, COVID)  RVPGX2  GASTROINTESTINAL PANEL BY PCR, STOOL (REPLACES STOOL CULTURE)  C DIFFICILE QUICK SCREEN W PCR REFLEX    LIPASE, BLOOD  PREGNANCY, URINE  Radiology CT ABDOMEN PELVIS W CONTRAST Result Date: 01/19/2024 CLINICAL DATA:  Right lower quadrant abdominal pain. Left lower quadrant pain. Nausea, vomiting, diarrhea. Central chest tightness. EXAM: CT ABDOMEN AND PELVIS WITH CONTRAST TECHNIQUE: Multidetector CT imaging of the abdomen and pelvis was performed using the standard protocol following bolus administration of intravenous contrast. RADIATION DOSE REDUCTION: This exam was performed according to the departmental dose-optimization program which includes automated exposure control, adjustment of the mA and/or kV according to patient size and/or use of iterative reconstruction technique. CONTRAST:  OMNIPAQUE IOHEXOL 300 MG/ML  SOLN COMPARISON:  02/24/2023 FINDINGS: Lower chest: Lung bases are clear. Hepatobiliary: No focal liver lesions. Cholelithiasis with several small stones suggested in the gallbladder. No gallbladder wall thickening or stranding. No bile duct dilatation. Pancreas: Unremarkable. No pancreatic ductal dilatation or surrounding inflammatory changes. Spleen: Normal in size without focal abnormality. Adrenals/Urinary Tract: Adrenal glands are unremarkable. Kidneys are normal, without renal calculi, focal lesion, or hydronephrosis. Bladder is decompressed. Stomach/Bowel: Stomach, small bowel, and colon are not abnormally distended. No wall  thickening or inflammatory changes are appreciated. Appendix is normal. Vascular/Lymphatic: No significant vascular findings are present. No enlarged abdominal or pelvic lymph nodes. Reproductive: Uterus and left ovary are normal. Dominant cyst in the right ovary measuring 5.2 cm diameter. This is mildly enlarged since the prior study. No wall thickening or nodularity is identified. Other: No abdominal wall hernia or abnormality. No abdominopelvic ascites. Musculoskeletal: No acute or significant osseous findings. IMPRESSION: 1. No evidence of bowel obstruction or inflammation. 2. Cholelithiasis without evidence of acute cholecystitis. 3. 5.2 cm cyst in the right ovary, enlarging since prior study. Follow-up by [place] is recommended in 3-6 months. Note: This recommendation does not apply to premenarchal patients and to those with increased risk (genetic, family history, elevated tumor markers or other high-risk factors) of ovarian cancer. Reference: JACR 2020 Feb; 17(2):248-254 Electronically Signed   By: Burman Nieves M.D.   On: 01/19/2024 02:22    Pertinent labs & imaging results that were available during my care of the patient were reviewed by me and considered in my medical decision making (see MDM for details).  Medications Ordered in ED Medications  ondansetron (ZOFRAN-ODT) disintegrating tablet 4 mg (has no administration in time range)  sodium chloride 0.9 % bolus 1,000 mL (0 mLs Intravenous Stopped 01/19/24 0230)  morphine (PF) 4 MG/ML injection 4 mg (4 mg Intravenous Given 01/19/24 0129)  ondansetron (ZOFRAN) injection 4 mg (4 mg Intravenous Given 01/19/24 0129)  iohexol (OMNIPAQUE) 300 MG/ML solution 100 mL (100 mLs Intravenous Contrast Given 01/19/24 0217)  dicyclomine (BENTYL) capsule 10 mg (10 mg Oral Given 01/19/24 0305)  sucralfate (CARAFATE) tablet 1 g (1 g Oral Given 01/19/24 0305)  ketorolac (TORADOL) 15 MG/ML injection 15 mg (15 mg Intravenous Given 01/19/24 0304)  sodium chloride 0.9 %  bolus 1,000 mL (0 mLs Intravenous Stopped 01/19/24 0403)  prochlorperazine (COMPAZINE) injection 5 mg (5 mg Intravenous Given 01/19/24 0519)  diphenhydrAMINE (BENADRYL) injection 25 mg (25 mg Intravenous Given 01/19/24 0518)  Procedures Procedures  (including critical care time)  Medical Decision Making / ED Course    Medical Decision Making:    Prince Olivier is a 28 y.o. female with past medical history as below, significant for anemia, anxiety, asthma, depression, fibroids, ovarian cyst who presents to the ED with complaint of abdominal pain, nausea vomiting diarrhea. The complaint involves an extensive differential diagnosis and also carries with it a high risk of complications and morbidity.  Serious etiology was considered. Ddx includes but is not limited to: Differential diagnosis includes but is not exclusive to ectopic pregnancy, ovarian cyst, ovarian torsion, acute appendicitis, urinary tract infection, endometriosis, bowel obstruction, hernia, colitis, renal colic, gastroenteritis, volvulus etc.   Complete initial physical exam performed, notably the patient was in no acute distress, abdomen nonperitoneal.    Reviewed and confirmed nursing documentation for past medical history, family history, social history.  Vital signs reviewed.     Clinical Course as of 01/19/24 0709  Sat Jan 19, 2024  0117 Hemoglobin(!): 10.4 Similar to prior [SG]  0340 Feeling better on recheck  [SG]  0704 Feeling better on recheck, tolerating p.o. without difficulty. [SG]    Clinical Course User Index [SG] Sloan Leiter, DO    Brief summary: 28 year old female history as above here with lower quad abdominal pain, nausea vomiting diarrhea. Labs reviewed, potassium is low, she has mild leukocytosis, hemoglobin is also low, similar to prior. Imaging does show ovarian  cyst, increased from prior. Will give GI cocktail, analgesic, IV fluids.  Labs and imaging reviewed, she has ovarian cyst, potassium is low.  She is ovarian cyst on CT, given stable abdominal exam suspicion for ovarian torsion is reduced at this time.  Encouraged outpatient OB/GYN follow-up regarding ovarian cyst and o/p ultrasound.  She also has cholelithiasis, no evidence of cholecystitis.  Negative Murphy sign.  Cholecystitis seems less likely at this time.  She is feeling much better on recheck, she is tolerant p.o. without difficulty.  Gastroenteritis or foodborne illness seems most likely etiology of her complaints today.       The patient improved significantly and was discharged in stable condition. Detailed discussions were had with the patient/guardian regarding current findings, and need for close f/u with PCP or on call doctor. The patient/guardian has been instructed to return immediately if the symptoms worsen in any way for re-evaluation. Patient/guardian verbalized understanding and is in agreement with current care plan. All questions answered prior to discharge.              Additional history obtained: -Additional history obtained from spouse -External records from outside source obtained and reviewed including: Chart review including previous notes, labs, imaging, consultation notes including  Prior admission, prior ED visits   Lab Tests: -I ordered, reviewed, and interpreted labs.   The pertinent results include:   Labs Reviewed  COMPREHENSIVE METABOLIC PANEL - Abnormal; Notable for the following components:      Result Value   Potassium 3.2 (*)    CO2 20 (*)    Glucose, Bld 100 (*)    All other components within normal limits  CBC - Abnormal; Notable for the following components:   WBC 11.5 (*)    Hemoglobin 10.4 (*)    HCT 33.3 (*)    MCV 78.7 (*)    MCH 24.6 (*)    RDW 16.4 (*)    Platelets 492 (*)    All other components within normal limits   URINALYSIS, ROUTINE W REFLEX MICROSCOPIC -  Abnormal; Notable for the following components:   Ketones, ur 40 (*)    All other components within normal limits  RESP PANEL BY RT-PCR (RSV, FLU A&B, COVID)  RVPGX2  GASTROINTESTINAL PANEL BY PCR, STOOL (REPLACES STOOL CULTURE)  C DIFFICILE QUICK SCREEN W PCR REFLEX    LIPASE, BLOOD  PREGNANCY, URINE    Notable for hypokalemia, anemia, ketonuria  EKG   EKG Interpretation Date/Time:  Saturday January 19 2024 00:01:14 EDT Ventricular Rate:  88 PR Interval:  174 QRS Duration:  90 QT Interval:  364 QTC Calculation: 440 R Axis:   87  Text Interpretation: Normal sinus rhythm Right atrial enlargement Borderline ECG When compared with ECG of 02-Aug-2023 23:10, PREVIOUS ECG IS PRESENT Confirmed by Tanda Rockers ([id number]) on 01/19/2024 5:03:01 AM         Imaging Studies ordered: I ordered imaging studies including CT abdomen pelvis I independently visualized the following imaging with scope of interpretation limited to determining acute life threatening conditions related to emergency care; findings noted above I independently visualized and interpreted imaging. I agree with the radiologist interpretation   Medicines ordered and prescription drug management: Meds ordered this encounter  Medications   ondansetron (ZOFRAN-ODT) disintegrating tablet 4 mg   sodium chloride 0.9 % bolus 1,000 mL   morphine (PF) 4 MG/ML injection 4 mg   ondansetron (ZOFRAN) injection 4 mg   iohexol (OMNIPAQUE) 300 MG/ML solution 100 mL   dicyclomine (BENTYL) capsule 10 mg   sucralfate (CARAFATE) tablet 1 g   ketorolac (TORADOL) 15 MG/ML injection 15 mg   sodium chloride 0.9 % bolus 1,000 mL   prochlorperazine (COMPAZINE) injection 5 mg   diphenhydrAMINE (BENADRYL) injection 25 mg   ondansetron (ZOFRAN) 4 MG tablet    Sig: Take 1 tablet (4 mg total) by mouth every 4 (four) hours as needed for nausea or vomiting.    Dispense:  6 tablet    Refill:  0   sucralfate  (CARAFATE) 1 g tablet    Sig: Take 1 tablet (1 g total) by mouth with breakfast, with lunch, and with evening meal for 7 days.    Dispense:  21 tablet    Refill:  0   potassium chloride SA (KLOR-CON M) 20 MEQ tablet    Sig: Take 2 tablets (40 mEq total) by mouth daily for 1 day.    Dispense:  1 tablet    Refill:  0    -I have reviewed the patients home medicines and have made adjustments as needed   Consultations Obtained: na   Cardiac Monitoring: Continuous pulse oximetry interpreted by myself, 100% on RA.    Social Determinants of Health:  Diagnosis or treatment significantly limited by social determinants of health: na   Reevaluation: After the interventions noted above, I reevaluated the patient and found that they have improved  Co morbidities that complicate the patient evaluation  Past Medical History:  Diagnosis Date   Anemia    Anxiety    Asthma    Depression    Fibroid    Hyperthyroidism    Ovarian cyst    Seizures (HCC)    psuedo seizures,  stress related      Dispostion: Disposition decision including need for hospitalization was considered, and patient discharged from emergency department.    Final Clinical Impression(s) / ED Diagnoses Final diagnoses:  Generalized abdominal pain  Cyst of right ovary  Nausea vomiting and diarrhea  Biliary calculus of other site without obstruction  Hypokalemia  Sloan Leiter, DO 01/19/24 [phone]

## 2024-01-19 NOTE — Discharge Instructions (Addendum)
 You should return to the hospital if you experience return of persistent nausea and vomiting that does not resolve and does not allow you to tolerate any food or fluids, persistent fevers for greater than 2-3 more days, increasing abdominal pain that persists despite medications, persistent diarrhea, dizziness, syncope (fainting), or for any other concerns.   Please follow-up with OB/GYN regarding ovarian cyst, recommend ultrasound in the next 3 to 6 months for ongoing surveillance  The results of your stool studies should be available on MyChart in the next 24 hours.  Please return to the emergency department immediately for any new or concerning symptoms, or if you get worse.

## 2024-01-20 LAB — GASTROINTESTINAL PANEL BY PCR, STOOL (REPLACES STOOL CULTURE)

## 2024-07-19 ENCOUNTER — Emergency Department (HOSPITAL_COMMUNITY): Payer: Self-pay

## 2024-07-19 ENCOUNTER — Emergency Department (HOSPITAL_COMMUNITY)
Admission: EM | Admit: 2024-07-19 | Discharge: 2024-07-19 | Disposition: A | Discharge status: Home / Self Care | Payer: Self-pay | Attending: Emergency Medicine | Admitting: Emergency Medicine

## 2024-07-19 ENCOUNTER — Encounter (HOSPITAL_COMMUNITY): Payer: Self-pay | Admitting: Emergency Medicine

## 2024-07-19 ENCOUNTER — Other Ambulatory Visit: Payer: Self-pay

## 2024-07-19 DIAGNOSIS — N83202 Unspecified ovarian cyst, left side: Secondary | ICD-10-CM | POA: Insufficient documentation

## 2024-07-19 DIAGNOSIS — J45909 Unspecified asthma, uncomplicated: Secondary | ICD-10-CM | POA: Insufficient documentation

## 2024-07-19 DIAGNOSIS — N83201 Unspecified ovarian cyst, right side: Secondary | ICD-10-CM | POA: Insufficient documentation

## 2024-07-19 LAB — COMPREHENSIVE METABOLIC PANEL WITH GFR
ALT: 13 U/L (ref 0–44)
AST: 20 U/L (ref 15–41)
Albumin: 4.4 g/dL (ref 3.5–5.0)
Alkaline Phosphatase: 42 U/L (ref 38–126)
Anion gap: 15 (ref 5–15)
BUN: 11 mg/dL (ref 6–20)
CO2: 21 mmol/L — ABNORMAL LOW (ref 22–32)
Calcium: 9.3 mg/dL (ref 8.9–10.3)
Chloride: 101 mmol/L (ref 98–111)
Creatinine, Ser: 0.69 mg/dL (ref 0.44–1.00)
GFR, Estimated: >60 mL/min (ref >60)
Glucose, Bld: 90 mg/dL (ref 70–99)
Potassium: 3.4 mmol/L — ABNORMAL LOW (ref 3.5–5.1)
Sodium: 137 mmol/L (ref 135–145)
Total Bilirubin: 0.5 mg/dL (ref 0.0–1.2)
Total Protein: 7.3 g/dL (ref 6.5–8.1)

## 2024-07-19 LAB — URINALYSIS, ROUTINE W REFLEX MICROSCOPIC
Glucose, UA: NEGATIVE mg/dL
Ketones, ur: 5 mg/dL — AB
Leukocytes,Ua: NEGATIVE
Nitrite: NEGATIVE
Protein, ur: 30 mg/dL — AB
Specific Gravity, Urine: 1.03 (ref 1.005–1.030)
pH: 5 (ref 5.0–8.0)

## 2024-07-19 LAB — HCG, SERUM, QUALITATIVE: Preg, Serum: NEGATIVE

## 2024-07-19 LAB — TROPONIN I (HIGH SENSITIVITY)
Troponin I (High Sensitivity): <2 ng/L
Troponin I (High Sensitivity): <2 ng/L (ref <18)

## 2024-07-19 LAB — CBC
HCT: 32.7 % — ABNORMAL LOW (ref 36.0–46.0)
Hemoglobin: 9.8 g/dL — ABNORMAL LOW (ref 12.0–15.0)
MCH: 23.7 pg — ABNORMAL LOW (ref 26.0–34.0)
MCHC: 30 g/dL (ref 30.0–36.0)
MCV: 79.2 fL — ABNORMAL LOW (ref 80.0–100.0)
Platelets: 510 K/uL — ABNORMAL HIGH (ref 150–400)
RBC: 4.13 MIL/uL (ref 3.87–5.11)
RDW: 17.1 % — ABNORMAL HIGH (ref 11.5–15.5)
WBC: 4.9 K/uL (ref 4.0–10.5)
nRBC: 0 % (ref 0.0–0.2)

## 2024-07-19 LAB — LIPASE, BLOOD: Lipase: 21 U/L (ref 11–51)

## 2024-07-19 MED ORDER — IOHEXOL 350 MG/ML SOLN
75.0000 mL | Freq: Once | INTRAVENOUS | Status: AC | PRN
  Administered 2024-07-19: 75 mL via INTRAVENOUS

## 2024-07-19 MED ORDER — IPRATROPIUM-ALBUTEROL 0.5-2.5 (3) MG/3ML IN SOLN
3.0000 mL | Freq: Once | RESPIRATORY_TRACT | Status: AC
  Administered 2024-07-19: 3 mL via RESPIRATORY_TRACT
  Filled 2024-07-19: qty 3

## 2024-07-19 MED ORDER — SODIUM CHLORIDE 0.9 % IV BOLUS
1000.0000 mL | Freq: Once | INTRAVENOUS | Status: AC
  Administered 2024-07-19: 1000 mL via INTRAVENOUS

## 2024-07-19 MED ORDER — KETOROLAC TROMETHAMINE 30 MG/ML IJ SOLN
15.0000 mg | Freq: Once | INTRAMUSCULAR | Status: AC
  Administered 2024-07-19: 15 mg via INTRAVENOUS
  Filled 2024-07-19: qty 1

## 2024-07-19 MED ORDER — MORPHINE SULFATE (PF) 4 MG/ML IV SOLN
4.0000 mg | Freq: Once | INTRAVENOUS | Status: AC
  Administered 2024-07-19: 4 mg via INTRAVENOUS
  Filled 2024-07-19: qty 1

## 2024-07-19 MED ORDER — LORAZEPAM 1 MG PO TABS
1.0000 mg | ORAL_TABLET | Freq: Once | ORAL | Status: AC
  Administered 2024-07-19: 1 mg via ORAL
  Filled 2024-07-19: qty 1

## 2024-07-19 MED ORDER — CELECOXIB 100 MG PO CAPS: 100.0000 mg | ORAL_CAPSULE | Freq: Two times a day (BID) | ORAL | 0 refills | Status: AC

## 2024-07-19 MED ORDER — ONDANSETRON HCL 4 MG/2ML IJ SOLN
4.0000 mg | Freq: Once | INTRAMUSCULAR | Status: AC
  Administered 2024-07-19: 4 mg via INTRAVENOUS
  Filled 2024-07-19: qty 2

## 2024-07-19 NOTE — ED Provider Notes (Incomplete)
  EMERGENCY DEPARTMENT AT [facility] Provider Note   CSN: [id number] Arrival date & time: 07/19/24  1248     Patient presents with: Chest Pain and Abdominal Pain   Debbie Stewart is a 28 y.o. female here for evaluation of right lower quadrant abdominal pain.  She has a known large right ovarian cyst.  Recently completed her menstrual cycle.  Radiates into her right lower back.  States pain started while taking a test.  She also notes that this morning she developed chest pain and short of breath.  On the left side of her chest.  States has a history of anxiety as well as asthma.  Pain feels similar to when she has had anxiety attacks.  Shortness of breath worsens when her right lower quadrant pain worsens.  No recent URI symptoms.  No loose stool, bloody stool, dysuria, hematuria.  Pain is described as sharp in nature.  No prior ovarian torsion.  Denies chance of pregnancy.  No history of PE or DVT.  Episode of NBNB emesis when abdominal pain started  {Add pertinent medical, surgical, social history, OB history to HPI:32947} HPI     Prior to Admission medications   Medication Sig Start Date End Date Taking? Authorizing Provider  celecoxib  (CELEBREX ) 100 MG capsule Take 1 capsule (100 mg total) by mouth 2 (two) times daily for 14 days. 07/19/24 08/02/24 Yes Shantell Belongia A, PA-C  albuterol  (PROVENTIL ) (2.5 MG/3ML) 0.083% nebulizer solution Take 3 mLs (2.5 mg total) by nebulization 4 (four) times daily for 7 days, THEN 3 mLs (2.5 mg total) every 4 (four) hours as needed for shortness of breath. 09/14/23 10/21/23  Tobie Yetta HERO, MD  albuterol  (VENTOLIN  HFA) 108 (90 Base) MCG/ACT inhaler Inhale 1 puff into the lungs every 6 (six) hours as needed for wheezing or shortness of breath. 09/12/23   Robinson, John K, PA-C  ALPRAZolam  (XANAX ) 0.5 MG tablet Take 1 tablet (0.5 mg total) by mouth 2 (two) times daily. Patient taking differently: Take 0.5 mg by mouth as needed for  anxiety. 08/03/23   Elgergawy, Brayton RAMAN, MD  Azelastine HCl 137 MCG/SPRAY SOLN Place 1 spray into both nostrils every 4 (four) hours as needed (congestion). 08/30/23   [provider]  benzonatate  (TESSALON ) 100 MG capsule Take 1 capsule (100 mg total) by mouth 3 (three) times daily. 09/14/23   Tobie Yetta HERO, MD  CHLOROPHYLL PO Take 2 drops by mouth 3 (three) times a week.    [provider]  EPINEPHrine  0.3 mg/0.3 mL IJ SOAJ injection Inject 0.3 mg into the muscle as needed for anaphylaxis. Patient not taking: Reported on 08/01/2023 07/25/23   Patt Alm Macho, MD  guaiFENesin  (MUCINEX ) 600 MG 12 hr tablet Take 1 tablet (600 mg total) by mouth 2 (two) times daily. 09/14/23   Tobie Yetta HERO, MD  HYDROcodone  bit-homatropine [facility]) 5-1.5 MG/5ML syrup Take 5 mLs by mouth every 6 (six) hours as needed for cough. 09/14/23   Tobie Yetta HERO, MD  hydrOXYzine  (ATARAX ) 25 MG tablet Take 1 tablet (25 mg total) by mouth every 8 (eight) hours as needed for anxiety. 09/14/23   Tobie Yetta HERO, MD  ibuprofen  (ADVIL ) 200 MG tablet Take 400 mg by mouth as needed for moderate pain (pain score 4-6) or mild pain (pain score 1-3).    [provider]  MAGNESIUM  PO Take 1 Scoop by mouth at bedtime.    [provider]  mometasone -formoterol  (DULERA ) 200-5 MCG/ACT AERO Inhale 2  puffs into the lungs 2 (two) times daily. 09/14/23   Tobie Yetta HERO, MD  Multiple Vitamins-Minerals (MULTIVITAMIN WITH MINERALS) tablet Take 1 tablet by mouth daily.    [provider]  ondansetron  (ZOFRAN ) 4 MG tablet Take 1 tablet (4 mg total) by mouth every 4 (four) hours as needed for nausea or vomiting. 01/19/24   Elnor Jayson LABOR, DO  pantoprazole  (PROTONIX ) 40 MG tablet Take 1 tablet (40 mg total) by mouth 2 (two) times daily before a meal. Patient taking differently: Take 40 mg by mouth daily as needed (reflux). 08/03/23   Elgergawy, Brayton RAMAN, MD  potassium chloride  SA (KLOR-CON  M) 20 MEQ tablet  Take 2 tablets (40 mEq total) by mouth daily for 1 day. 01/19/24 01/20/24  Elnor Jayson LABOR, DO  predniSONE  (DELTASONE ) 10 MG tablet Take 50mg  daily for 3days,Take 40mg  daily for 3days,Take 30mg  daily for 3days,Take 20mg  daily for 3days,Take 10mg  daily for 3days, then stop 09/14/23   Tobie Yetta HERO, MD  sucralfate  (CARAFATE ) 1 g tablet Take 1 tablet (1 g total) by mouth with breakfast, with lunch, and with evening meal for 7 days. 01/19/24 01/26/24  Elnor Jayson LABOR, DO  triamcinolone ointment (KENALOG) 0.1 % Apply 1 Application topically 2 (two) times daily. Patient not taking: Reported on 09/13/2023 08/30/23   [provider]    Allergies: Dust mite extract and Shellfish allergy    Review of Systems  Constitutional: Negative.   HENT: Negative.    Respiratory:  Positive for shortness of breath.   Cardiovascular:  Positive for chest pain. Negative for palpitations and leg swelling.  Gastrointestinal:  Positive for abdominal pain, nausea and vomiting. Negative for anal bleeding, blood in stool, constipation, diarrhea and rectal pain.  Genitourinary:  Positive for pelvic pain. Negative for difficulty urinating, dysuria, flank pain, hematuria and urgency.  Musculoskeletal: Negative.   Skin: Negative.   Neurological: Negative.   All other systems reviewed and are negative.   Updated Vital Signs BP 109/79   Pulse 65   Temp 98.1 F (36.7 C) (Oral)   Resp 20   Ht 5' 6 (1.676 m)   Wt 75 kg   SpO2 100%   BMI 26.69 kg/m   Physical Exam Vitals and nursing note reviewed.  Constitutional:      General: She is not in acute distress.    Appearance: She is well-developed. She is not ill-appearing, toxic-appearing or diaphoretic.  HENT:     Head: Atraumatic.  Eyes:     Pupils: Pupils are equal, round, and reactive to light.  Cardiovascular:     Rate and Rhythm: Normal rate.     Pulses:          Radial pulses are 2+ on the right side and 2+ on the left side.       Dorsalis pedis  pulses are 2+ on the right side and 2+ on the left side.     Heart sounds: Normal heart sounds.  Pulmonary:     Effort: Pulmonary effort is normal. No respiratory distress.     Breath sounds: Normal breath sounds.     Comments: No expiratory wheeze.  Speaks without difficulty. Chest:     Comments: Nontender Abdominal:     General: Bowel sounds are normal. There is no distension.     Palpations: Abdomen is soft.     Tenderness: There is abdominal tenderness in the right lower quadrant and suprapubic area. There is guarding.     Comments: Diffuse tenderness suprapubic  and right lower quadrant  Musculoskeletal:        General: Normal range of motion.     Cervical back: Normal range of motion.     Comments: Nontender bilateral lower extremities.  Compartments soft.  No lower extremity edema  Skin:    General: Skin is warm and dry.     Capillary Refill: Capillary refill takes less than 2 seconds.     Comments: No obvious rashes to exposed skin  Neurological:     General: No focal deficit present.     Mental Status: She is alert.  Psychiatric:        Mood and Affect: Mood normal.    (all labs ordered are listed, but only abnormal results are displayed) Labs Reviewed  COMPREHENSIVE METABOLIC PANEL WITH GFR - Abnormal; Notable for the following components:      Result Value   Potassium 3.4 (*)    CO2 21 (*)    All other components within normal limits  CBC - Abnormal; Notable for the following components:   Hemoglobin 9.8 (*)    HCT 32.7 (*)    MCV 79.2 (*)    MCH 23.7 (*)    RDW 17.1 (*)    Platelets 510 (*)    All other components within normal limits  URINALYSIS, ROUTINE W REFLEX MICROSCOPIC - Abnormal; Notable for the following components:   APPearance CLOUDY (*)    Hgb urine dipstick MODERATE (*)    Bilirubin Urine MODERATE (*)    Ketones, ur 5 (*)    Protein, ur 30 (*)    Bacteria, UA RARE (*)    All other components within normal limits  LIPASE, BLOOD  HCG, SERUM,  QUALITATIVE  TROPONIN I (HIGH SENSITIVITY)  TROPONIN I (HIGH SENSITIVITY)    EKG: EKG Interpretation Date/Time:  Saturday July 19 2024 17:24:37 EDT Ventricular Rate:  64 PR Interval:  199 QRS Duration:  112 QT Interval:  449 QTC Calculation: 464 R Axis:   66  Text Interpretation: Sinus rhythm Consider left atrial enlargement Borderline intraventricular conduction delay RSR' in V1 or V2, probably normal variant Nonspecific T abnrm, anterolateral leads when compared to prior, similar appeaance no sTEMI Confirmed by Ginger Barefoot ([id number]) on 07/19/2024 5:51:52 PM  Radiology: CT ABDOMEN PELVIS W CONTRAST Result Date: 07/19/2024 CLINICAL DATA:  Right lower quadrant abdominal pain. EXAM: CT ABDOMEN AND PELVIS WITH CONTRAST TECHNIQUE: Multidetector CT imaging of the abdomen and pelvis was performed using the standard protocol following bolus administration of intravenous contrast. RADIATION DOSE REDUCTION: This exam was performed according to the departmental dose-optimization program which includes automated exposure control, adjustment of the mA and/or kV according to patient size and/or use of iterative reconstruction technique. CONTRAST:  75mL OMNIPAQUE  IOHEXOL  350 MG/ML SOLN COMPARISON:  Pelvic ultrasound 07/19/2024. CT abdomen and pelvis 01/19/2024. FINDINGS: Lower chest: No acute abnormality. Hepatobiliary: No focal liver abnormality is seen. No gallstones, gallbladder wall thickening, or biliary dilatation. Pancreas: Unremarkable. No pancreatic ductal dilatation or surrounding inflammatory changes. Spleen: Normal in size without focal abnormality. Adrenals/Urinary Tract: Adrenal glands are unremarkable. Kidneys are normal, without renal calculi, focal lesion, or hydronephrosis. Bladder is unremarkable. Stomach/Bowel: Stomach is within normal limits. Appendix appears normal. No evidence of bowel wall thickening, distention, or inflammatory changes. Vascular/Lymphatic: No significant vascular  findings are present. No enlarged abdominal or pelvic lymph nodes. Reproductive: There is a 4.4 by 5.0 cm right ovarian cyst as seen on recent ultrasound. Left adnexa and uterus are within normal limits. Other: There  is a small amount of free fluid in the pelvis. There is no focal abdominal wall hernia or free air. Musculoskeletal: No fracture is seen. IMPRESSION: 1. 5 cm right ovarian cyst as seen on recent ultrasound. 2. Small amount of free fluid in the pelvis. 3. Normal appendix. Electronically Signed   By: Greig Pique M.D.   On: 07/19/2024 18:52   US  PELVIC COMPLETE W TRANSVAGINAL AND TORSION R/O Result Date: 07/19/2024 CLINICAL DATA:  Pelvic pain. EXAM: TRANSABDOMINAL AND TRANSVAGINAL ULTRASOUND OF PELVIS DOPPLER ULTRASOUND OF OVARIES TECHNIQUE: Both transabdominal and transvaginal ultrasound examinations of the pelvis were performed. Transabdominal technique was performed for global imaging of the pelvis including uterus, ovaries, adnexal regions, and pelvic cul-de-sac. It was necessary to proceed with endovaginal exam following the transabdominal exam to visualize the uterus, endometrium, bilateral ovaries and bilateral adnexa. Color and duplex Doppler ultrasound was utilized to evaluate blood flow to the ovaries. COMPARISON:  June 20, 2022 FINDINGS: Uterus Measurements: 7.0 cm x 3.9 cm x 4.6 cm = volume: 66.6 mL. No fibroids or other mass visualized. Endometrium Thickness: 1.3 mm.  No focal abnormality visualized. Right ovary Measurements: 5.5 cm x 4.3 cm x 4.9 cm = volume: 59.9 mL. A 5.0 cm x 4.7 cm x 4.6 cm simple right ovarian cyst is seen. This is present on the prior study. A thin layer of normal ovarian parenchyma is seen along its periphery. Left ovary Measurements: 3.2 cm x 1.9 cm x 2.4 cm = volume: 7.8 mL. Normal appearance/no adnexal mass. Pulsed Doppler evaluation of both ovaries demonstrates normal low-resistance arterial and venous waveforms. It should be noted that flow within the RIGHT  ovary is limited in evaluation secondary to the previously noted large RIGHT ovarian cyst. Other findings No abnormal free fluid. IMPRESSION: Large simple RIGHT ovarian cyst. Electronically Signed   By: Suzen Dials M.D.   On: 07/19/2024 17:08   DG Chest 2 View Result Date: 07/19/2024 EXAM: 2 VIEW(S) XRAY OF THE CHEST 07/19/2024 01:52:15 PM COMPARISON: 08/02/2023 CLINICAL HISTORY: Asthma, SOB. Complains of SOB, dizziness, abdominal pain; hx of asthma, anemia, ovarian cyst. FINDINGS: LUNGS AND PLEURA: No focal pulmonary opacity. No pulmonary edema. No pleural effusion. No pneumothorax. HEART AND MEDIASTINUM: No acute abnormality of the cardiac and mediastinal silhouettes. BONES AND SOFT TISSUES: No acute osseous abnormality. IMPRESSION: 1. No acute cardiopulmonary process. Electronically signed by: Waddell Calk MD 07/19/2024 02:25 PM EDT RP Workstation: [id number]    {Document cardiac monitor, telemetry assessment procedure when appropriate:32947} Procedures   Medications Ordered in the ED  ipratropium-albuterol  (DUONEB) 0.5-2.5 (3) MG/3ML nebulizer solution 3 mL (3 mLs Nebulization Given 07/19/24 1304)  ondansetron  (ZOFRAN ) injection 4 mg (4 mg Intravenous Given 07/19/24 1610)  morphine  (PF) 4 MG/ML injection 4 mg (4 mg Intravenous Given 07/19/24 1610)  sodium chloride  0.9 % bolus 1,000 mL (0 mLs Intravenous Stopped 07/19/24 1849)  ketorolac  (TORADOL ) 30 MG/ML injection 15 mg (15 mg Intravenous Given 07/19/24 1736)  LORazepam  (ATIVAN ) tablet 1 mg (1 mg Oral Given 07/19/24 1851)  iohexol  (OMNIPAQUE ) 350 MG/ML injection 75 mL (75 mLs Intravenous Contrast Given 07/19/24 7125)    28 year old here for evaluation of right lower abdominal pain.  Stated when the pain started she started getting anxious and developed chest pain.  Tried using her asthma inhaler without relief.  She has a known large right ovarian cyst, followed by OB/GYN.  Appears to    Clinical Course as of 07/20/24 0001  Sat Jul 19, 2024  1940 Patient reassessed.  Discussed labs and imaging.  Suspect symptoms likely related to ruptured cyst versus persistent large bilateral cyst.  No evidence of torsion.  CT scan reassuring.  Pending delta troponin.  Anticipate likely DC home afterwards. [BH]    Clinical Course User Index [BH] Kemiyah Tarazon A, PA-C   {Click here for ABCD2, HEART and other calculators REFRESH Note before signing:1}                              Medical Decision Making Amount and/or Complexity of Data Reviewed External Data Reviewed: labs, radiology, ECG and notes. Labs: ordered. Decision-making details documented in ED Course. Radiology: ordered and independent interpretation performed. Decision-making details documented in ED Course. ECG/medicine tests: ordered and independent interpretation performed. Decision-making details documented in ED Course.  Risk OTC drugs. Prescription drug management. Parenteral controlled substances. Decision regarding hospitalization. Diagnosis or treatment significantly limited by social determinants of health.     {Document critical care time when appropriate  Document review of labs and clinical decision tools ie CHADS2VASC2, etc  Document your independent review of radiology images and any outside records  Document your discussion with family members, caretakers and with consultants  Document social determinants of health affecting pt's care  Document your decision making why or why not admission, treatments were needed:32947:::1}   Final diagnoses:  Cysts of both ovaries    ED Discharge Orders          Ordered    celecoxib  (CELEBREX ) 100 MG capsule  2 times daily        07/19/24 2112

## 2024-07-19 NOTE — ED Provider Notes (Signed)
 [facility] Provider Note   CSN: [id number] Arrival date & time: 07/19/24  1248     Patient presents with: Chest Pain and Abdominal Pain   Debbie Stewart is a 28 y.o. female here for evaluation of right lower quadrant abdominal pain.  She has a known large right ovarian cyst.  Recently completed her menstrual cycle.  Radiates into her right lower back.  States pain started while taking a test.  She also notes that this morning she developed chest pain and short of breath.  On the left side of her chest.  States has a history of anxiety as well as asthma.  Pain feels similar to when she has had anxiety attacks.  Shortness of breath worsens when her right lower quadrant pain worsens.  No recent URI symptoms.  No loose stool, bloody stool, dysuria, hematuria.  Pain is described as sharp in nature.  No prior ovarian torsion.  Denies chance of pregnancy.  No history of PE or DVT.  Episode of NBNB emesis when abdominal pain started  {Add pertinent medical, surgical, social history, OB history to HPI:32947} HPI     Prior to Admission medications   Medication Sig Start Date End Date Taking? Authorizing Provider  celecoxib  (CELEBREX ) 100 MG capsule Take 1 capsule (100 mg total) by mouth 2 (two) times daily for 14 days. 07/19/24 08/02/24 Yes Buren Havey A, PA-C  albuterol  (PROVENTIL ) (2.5 MG/3ML) 0.083% nebulizer solution Take 3 mLs (2.5 mg total) by nebulization 4 (four) times daily for 7 days, THEN 3 mLs (2.5 mg total) every 4 (four) hours as needed for shortness of breath. 09/14/23 10/21/23  Tobie Yetta HERO, MD  albuterol  (VENTOLIN  HFA) [phone] Base) MCG/ACT inhaler Inhale 1 puff into the lungs every 6 (six) hours as needed for wheezing or shortness of breath. 09/12/23   Robinson, John K, PA-C  ALPRAZolam  (XANAX ) 0.5 MG tablet Take 1 tablet (0.5 mg total) by mouth 2 (two) times daily. Patient taking differently: Take 0.5 mg by mouth as needed for  anxiety. 08/03/23   Elgergawy, Brayton RAMAN, MD  Azelastine HCl 137 MCG/SPRAY SOLN Place 1 spray into both nostrils every 4 (four) hours as needed (congestion). 08/30/23   [provider]  benzonatate  (TESSALON ) 100 MG capsule Take 1 capsule (100 mg total) by mouth 3 (three) times daily. 09/14/23   Tobie Yetta HERO, MD  CHLOROPHYLL PO Take 2 drops by mouth 3 (three) times a week.    [provider]  EPINEPHrine  0.3 mg/0.3 mL IJ SOAJ injection Inject 0.3 mg into the muscle as needed for anaphylaxis. Patient not taking: Reported on 08/01/2023 07/25/23   Patt Alm Macho, MD  guaiFENesin  (MUCINEX ) 600 MG 12 hr tablet Take 1 tablet (600 mg total) by mouth 2 (two) times daily. 09/14/23   Tobie Yetta HERO, MD  HYDROcodone  bit-homatropine [facility]) 5-1.5 MG/5ML syrup Take 5 mLs by mouth every 6 (six) hours as needed for cough. 09/14/23   Tobie Yetta HERO, MD  hydrOXYzine  (ATARAX ) 25 MG tablet Take 1 tablet (25 mg total) by mouth every 8 (eight) hours as needed for anxiety. 09/14/23   Tobie Yetta HERO, MD  ibuprofen  (ADVIL ) 200 MG tablet Take 400 mg by mouth as needed for moderate pain (pain score 4-6) or mild pain (pain score 1-3).    [provider]  MAGNESIUM  PO Take 1 Scoop by mouth at bedtime.    [provider]  mometasone -formoterol  (DULERA ) 200-5 MCG/ACT AERO Inhale 2  puffs into the lungs 2 (two) times daily. 09/14/23   Tobie Yetta HERO, MD  Multiple Vitamins-Minerals (MULTIVITAMIN WITH MINERALS) tablet Take 1 tablet by mouth daily.    [provider]  ondansetron  (ZOFRAN ) 4 MG tablet Take 1 tablet (4 mg total) by mouth every 4 (four) hours as needed for nausea or vomiting. 01/19/24   Elnor Savant A, DO  pantoprazole  (PROTONIX ) 40 MG tablet Take 1 tablet (40 mg total) by mouth 2 (two) times daily before a meal. Patient taking differently: Take 40 mg by mouth daily as needed (reflux). 08/03/23   Elgergawy, Brayton RAMAN, MD  potassium chloride  SA (KLOR-CON  M) 20 MEQ tablet  Take 2 tablets (40 mEq total) by mouth daily for 1 day. 01/19/24 01/20/24  Elnor Savant LABOR, DO  predniSONE  (DELTASONE ) 10 MG tablet Take 50mg  daily for 3days,Take 40mg  daily for 3days,Take 30mg  daily for 3days,Take 20mg  daily for 3days,Take 10mg  daily for 3days, then stop 09/14/23   Tobie Yetta HERO, MD  sucralfate  (CARAFATE ) 1 g tablet Take 1 tablet (1 g total) by mouth with breakfast, with lunch, and with evening meal for 7 days. 01/19/24 01/26/24  Elnor Savant LABOR, DO  triamcinolone ointment (KENALOG) 0.1 % Apply 1 Application topically 2 (two) times daily. Patient not taking: Reported on 09/13/2023 08/30/23   [provider]    Allergies: Dust mite extract and Shellfish allergy    Review of Systems  Constitutional: Negative.   HENT: Negative.    Respiratory:  Positive for shortness of breath.   Cardiovascular:  Positive for chest pain. Negative for palpitations and leg swelling.  Gastrointestinal:  Positive for abdominal pain, nausea and vomiting. Negative for anal bleeding, blood in stool, constipation, diarrhea and rectal pain.  Genitourinary:  Positive for pelvic pain. Negative for difficulty urinating, dysuria, flank pain, hematuria and urgency.  Musculoskeletal: Negative.   Skin: Negative.   Neurological: Negative.   All other systems reviewed and are negative.   Updated Vital Signs BP 109/79   Pulse 65   Temp 98.1 F (36.7 C) (Oral)   Resp 20   Ht 5' 6 (1.676 m)   Wt 75 kg   SpO2 100%   BMI 26.69 kg/m   Physical Exam Vitals and nursing note reviewed.  Constitutional:      General: She is not in acute distress.    Appearance: She is well-developed. She is not ill-appearing, toxic-appearing or diaphoretic.  HENT:     Head: Atraumatic.  Eyes:     Pupils: Pupils are equal, round, and reactive to light.  Cardiovascular:     Rate and Rhythm: Normal rate.     Pulses:          Radial pulses are 2+ on the right side and 2+ on the left side.       Dorsalis pedis  pulses are 2+ on the right side and 2+ on the left side.     Heart sounds: Normal heart sounds.  Pulmonary:     Effort: Pulmonary effort is normal. No respiratory distress.     Breath sounds: Normal breath sounds.     Comments: No expiratory wheeze.  Speaks without difficulty. Chest:     Comments: Nontender Abdominal:     General: Bowel sounds are normal. There is no distension.     Palpations: Abdomen is soft.     Tenderness: There is abdominal tenderness in the right lower quadrant and suprapubic area. There is guarding.     Comments: Diffuse tenderness suprapubic  and right lower quadrant  Musculoskeletal:        General: Normal range of motion.     Cervical back: Normal range of motion.  Skin:    General: Skin is warm and dry.  Neurological:     General: No focal deficit present.     Mental Status: She is alert.  Psychiatric:        Mood and Affect: Mood normal.     (all labs ordered are listed, but only abnormal results are displayed) Labs Reviewed  COMPREHENSIVE METABOLIC PANEL WITH GFR - Abnormal; Notable for the following components:      Result Value   Potassium 3.4 (*)    CO2 21 (*)    All other components within normal limits  CBC - Abnormal; Notable for the following components:   Hemoglobin 9.8 (*)    HCT 32.7 (*)    MCV 79.2 (*)    MCH 23.7 (*)    RDW 17.1 (*)    Platelets 510 (*)    All other components within normal limits  URINALYSIS, ROUTINE W REFLEX MICROSCOPIC - Abnormal; Notable for the following components:   APPearance CLOUDY (*)    Hgb urine dipstick MODERATE (*)    Bilirubin Urine MODERATE (*)    Ketones, ur 5 (*)    Protein, ur 30 (*)    Bacteria, UA RARE (*)    All other components within normal limits  LIPASE, BLOOD  HCG, SERUM, QUALITATIVE  TROPONIN I (HIGH SENSITIVITY)  TROPONIN I (HIGH SENSITIVITY)    EKG: EKG Interpretation Date/Time:  Saturday July 19 2024 17:24:37 EDT Ventricular Rate:  64 PR Interval:  199 QRS  Duration:  112 QT Interval:  449 QTC Calculation: 464 R Axis:   66  Text Interpretation: Sinus rhythm Consider left atrial enlargement Borderline intraventricular conduction delay RSR' in V1 or V2, probably normal variant Nonspecific T abnrm, anterolateral leads when compared to prior, similar appeaance no sTEMI Confirmed by Ginger Barefoot ([id number]) on 07/19/2024 5:51:52 PM  Radiology: CT ABDOMEN PELVIS W CONTRAST Result Date: 07/19/2024 CLINICAL DATA:  Right lower quadrant abdominal pain. EXAM: CT ABDOMEN AND PELVIS WITH CONTRAST TECHNIQUE: Multidetector CT imaging of the abdomen and pelvis was performed using the standard protocol following bolus administration of intravenous contrast. RADIATION DOSE REDUCTION: This exam was performed according to the departmental dose-optimization program which includes automated exposure control, adjustment of the mA and/or kV according to patient size and/or use of iterative reconstruction technique. CONTRAST:  75mL OMNIPAQUE  IOHEXOL  350 MG/ML SOLN COMPARISON:  Pelvic ultrasound 07/19/2024. CT abdomen and pelvis 01/19/2024. FINDINGS: Lower chest: No acute abnormality. Hepatobiliary: No focal liver abnormality is seen. No gallstones, gallbladder wall thickening, or biliary dilatation. Pancreas: Unremarkable. No pancreatic ductal dilatation or surrounding inflammatory changes. Spleen: Normal in size without focal abnormality. Adrenals/Urinary Tract: Adrenal glands are unremarkable. Kidneys are normal, without renal calculi, focal lesion, or hydronephrosis. Bladder is unremarkable. Stomach/Bowel: Stomach is within normal limits. Appendix appears normal. No evidence of bowel wall thickening, distention, or inflammatory changes. Vascular/Lymphatic: No significant vascular findings are present. No enlarged abdominal or pelvic lymph nodes. Reproductive: There is a 4.4 by 5.0 cm right ovarian cyst as seen on recent ultrasound. Left adnexa and uterus are within normal limits.  Other: There is a small amount of free fluid in the pelvis. There is no focal abdominal wall hernia or free air. Musculoskeletal: No fracture is seen. IMPRESSION: 1. 5 cm right ovarian cyst as seen on recent ultrasound. 2. Small amount  of free fluid in the pelvis. 3. Normal appendix. Electronically Signed   By: Greig Pique M.D.   On: 07/19/2024 18:52   US  PELVIC COMPLETE W TRANSVAGINAL AND TORSION R/O Result Date: 07/19/2024 CLINICAL DATA:  Pelvic pain. EXAM: TRANSABDOMINAL AND TRANSVAGINAL ULTRASOUND OF PELVIS DOPPLER ULTRASOUND OF OVARIES TECHNIQUE: Both transabdominal and transvaginal ultrasound examinations of the pelvis were performed. Transabdominal technique was performed for global imaging of the pelvis including uterus, ovaries, adnexal regions, and pelvic cul-de-sac. It was necessary to proceed with endovaginal exam following the transabdominal exam to visualize the uterus, endometrium, bilateral ovaries and bilateral adnexa. Color and duplex Doppler ultrasound was utilized to evaluate blood flow to the ovaries. COMPARISON:  June 20, 2022 FINDINGS: Uterus Measurements: 7.0 cm x 3.9 cm x 4.6 cm = volume: 66.6 mL. No fibroids or other mass visualized. Endometrium Thickness: 1.3 mm.  No focal abnormality visualized. Right ovary Measurements: 5.5 cm x 4.3 cm x 4.9 cm = volume: 59.9 mL. A 5.0 cm x 4.7 cm x 4.6 cm simple right ovarian cyst is seen. This is present on the prior study. A thin layer of normal ovarian parenchyma is seen along its periphery. Left ovary Measurements: 3.2 cm x 1.9 cm x 2.4 cm = volume: 7.8 mL. Normal appearance/no adnexal mass. Pulsed Doppler evaluation of both ovaries demonstrates normal low-resistance arterial and venous waveforms. It should be noted that flow within the RIGHT ovary is limited in evaluation secondary to the previously noted large RIGHT ovarian cyst. Other findings No abnormal free fluid. IMPRESSION: Large simple RIGHT ovarian cyst. Electronically Signed   By:  Suzen Dials M.D.   On: 07/19/2024 17:08   DG Chest 2 View Result Date: 07/19/2024 EXAM: 2 VIEW(S) XRAY OF THE CHEST 07/19/2024 01:52:15 PM COMPARISON: 08/02/2023 CLINICAL HISTORY: Asthma, SOB. Complains of SOB, dizziness, abdominal pain; hx of asthma, anemia, ovarian cyst. FINDINGS: LUNGS AND PLEURA: No focal pulmonary opacity. No pulmonary edema. No pleural effusion. No pneumothorax. HEART AND MEDIASTINUM: No acute abnormality of the cardiac and mediastinal silhouettes. BONES AND SOFT TISSUES: No acute osseous abnormality. IMPRESSION: 1. No acute cardiopulmonary process. Electronically signed by: Waddell Calk MD 07/19/2024 02:25 PM EDT RP Workstation: [id number]    {Document cardiac monitor, telemetry assessment procedure when appropriate:32947} Procedures   Medications Ordered in the ED  ipratropium-albuterol  (DUONEB) 0.5-2.5 (3) MG/3ML nebulizer solution 3 mL (3 mLs Nebulization Given 07/19/24 1304)  ondansetron  (ZOFRAN ) injection 4 mg (4 mg Intravenous Given 07/19/24 1610)  morphine  (PF) 4 MG/ML injection 4 mg (4 mg Intravenous Given 07/19/24 1610)  sodium chloride  0.9 % bolus 1,000 mL (0 mLs Intravenous Stopped 07/19/24 1849)  ketorolac  (TORADOL ) 30 MG/ML injection 15 mg (15 mg Intravenous Given 07/19/24 1736)  LORazepam  (ATIVAN ) tablet 1 mg (1 mg Oral Given 07/19/24 1851)  iohexol  (OMNIPAQUE ) 350 MG/ML injection 75 mL (75 mLs Intravenous Contrast Given 07/19/24 2611)    28 year old here for evaluation of right lower abdominal pain.  Stated when the pain started she started getting anxious and developed chest pain.  Tried using her asthma inhaler without relief.  She has a known large right ovarian cyst.    Clinical Course as of 07/19/24 2119  Sat Jul 19, 2024  1940 Patient reassessed.  Discussed labs and imaging.  Suspect symptoms likely related to ruptured cyst versus persistent large bilateral cyst.  No evidence of torsion.  CT scan reassuring.  Pending delta troponin.  Anticipate  likely DC home afterwards. [BH]    Clinical Course User Index [BH]  Margarine Grosshans A, PA-C   {Click here for ABCD2, HEART and other calculators REFRESH Note before signing:1}                              Medical Decision Making Amount and/or Complexity of Data Reviewed Labs: ordered. Radiology: ordered.  Risk Prescription drug management.     {Document critical care time when appropriate  Document review of labs and clinical decision tools ie CHADS2VASC2, etc  Document your independent review of radiology images and any outside records  Document your discussion with family members, caretakers and with consultants  Document social determinants of health affecting pt's care  Document your decision making why or why not admission, treatments were needed:32947:::1}   Final diagnoses:  Cysts of both ovaries    ED Discharge Orders          Ordered    celecoxib  (CELEBREX ) 100 MG capsule  2 times daily        07/19/24 2112

## 2024-07-19 NOTE — Discharge Instructions (Addendum)
 It was a pleasure to care of you here in the emergency department  As we discussed you have cysts on both of your ovaries left, right however the 1 on the right is larger.  This is likely the source of your pain.  I have written you for a medication to help with your symptoms.  This medication is an anti-inflammatory which typically helps with cyst pain.  Do not take any additional ibuprofen , Aleve  while taking this medication.  If you develop bloody stool, upper, epigastric abdominal pain stop taking this medication.  Make sure to follow-up with your OB/GYN  Return for new or worsening symptom

## 2024-07-19 NOTE — ED Triage Notes (Signed)
 C/o sharp shooting pain in her right lower abd. States now she is having chest pain with radiation to left shoulder and arm. States this all started while taking a test

## 2024-07-19 NOTE — ED Triage Notes (Signed)
 Pt reports hx of panic attacks and anxiety. Reports sharp pain to right lower abd, dizziness that worsened since getting to hospital. One episode of emesis once pain started. Right lower back pain. Also complains of asthma with minimal relief with inhaler.
